# Patient Record
Sex: Female | Born: 1995 | Race: Black or African American | Hispanic: No | Marital: Single | Smoking: Current every day smoker
Health system: Southern US, Community
[De-identification: ages and names within clinical notes are randomized; demographics above are authoritative.]

## PROBLEM LIST (undated history)

## (undated) DIAGNOSIS — N898 Other specified noninflammatory disorders of vagina: Principal | ICD-10-CM

## (undated) DIAGNOSIS — B9689 Other specified bacterial agents as the cause of diseases classified elsewhere: Secondary | ICD-10-CM

## (undated) DIAGNOSIS — Z975 Presence of (intrauterine) contraceptive device: Secondary | ICD-10-CM

## (undated) DIAGNOSIS — R87629 Unspecified abnormal cytological findings in specimens from vagina: Secondary | ICD-10-CM

## (undated) DIAGNOSIS — N760 Acute vaginitis: Secondary | ICD-10-CM

## (undated) HISTORY — DX: Presence of (intrauterine) contraceptive device: Z97.5

## (undated) HISTORY — PX: INDUCED ABORTION: SHX677

## (undated) HISTORY — DX: Unspecified abnormal cytological findings in specimens from vagina: R87.629

## (undated) HISTORY — DX: Other specified noninflammatory disorders of vagina: N89.8

## (undated) HISTORY — DX: Other specified bacterial agents as the cause of diseases classified elsewhere: B96.89

## (undated) HISTORY — DX: Acute vaginitis: N76.0

---
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
## 2014-05-12 ENCOUNTER — Encounter (HOSPITAL_COMMUNITY): Payer: Self-pay | Admitting: Emergency Medicine

## 2014-05-12 ENCOUNTER — Emergency Department (HOSPITAL_COMMUNITY)
Admission: EM | Admit: 2014-05-12 | Discharge: 2014-05-12 | Disposition: A | Discharge status: Home / Self Care | Payer: BC Managed Care – PPO | Attending: Emergency Medicine | Admitting: Emergency Medicine

## 2014-05-12 DIAGNOSIS — F17200 Nicotine dependence, unspecified, uncomplicated: Secondary | ICD-10-CM | POA: Diagnosis not present

## 2014-05-12 DIAGNOSIS — J069 Acute upper respiratory infection, unspecified: Secondary | ICD-10-CM | POA: Insufficient documentation

## 2014-05-12 DIAGNOSIS — J029 Acute pharyngitis, unspecified: Secondary | ICD-10-CM | POA: Diagnosis not present

## 2014-05-12 DIAGNOSIS — Z79899 Other long term (current) drug therapy: Secondary | ICD-10-CM | POA: Insufficient documentation

## 2014-05-12 DIAGNOSIS — Z791 Long term (current) use of non-steroidal anti-inflammatories (NSAID): Secondary | ICD-10-CM | POA: Diagnosis not present

## 2014-05-12 DIAGNOSIS — J3489 Other specified disorders of nose and nasal sinuses: Secondary | ICD-10-CM | POA: Insufficient documentation

## 2014-05-12 LAB — RAPID STREP SCREEN (MED CTR MEBANE ONLY): Streptococcus, Group A Screen (Direct): NEGATIVE

## 2014-05-12 NOTE — ED Notes (Signed)
Pt reports sore throat. Voice hoarse in triage. Airway patent. nad noted. Pt denies any known fever.

## 2014-05-12 NOTE — ED Provider Notes (Signed)
CSN: [id number]     Arrival date & time 05/12/14  1554 History   First MD Initiated Contact with Patient 05/12/14 1741     Chief Complaint  Patient presents with  . Sore Throat    Patient is a 18 y.o. female presenting with pharyngitis. The history is provided by the patient. No language interpreter was used.  Sore Throat This is a new problem. The current episode started yesterday. The problem occurs constantly. Pertinent negatives include no chest pain, no abdominal pain, no headaches and no shortness of breath.   This chart was scribed for nurse practitioner Kerrie Buffalo , NP working with Joya Gaskins, MD, by Andrew Au, ED Scribe. This patient was seen in room APFT23/APFT23 and the patient's care was started at 5:42 PM.  Natasha Gill is a 18 y.o. female who presents to the Emergency Department complaining of sore throat 1 day. Pt rates sore throat 5/10.  She reports associated productive cough consisting of clear phlegm, hoarseness, and nasal congestion upon waking. 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 Pt denies fever,chills, erythematous, itchy eyes, abdominal pain, nausea, emesis, and diarrhea. She denies sick contacts at work.     History reviewed. No pertinent past medical history. Past Surgical History  Procedure Laterality Date  . Induced abortion     History reviewed. No pertinent family history. History  Substance Use Topics  . Smoking status: Heavy Tobacco Smoker -- 1.00 packs/day  . Smokeless tobacco: Not on file     Comment: pt reports a pack of cigarettes a week.  . Alcohol Use: No   OB History   Grav Para Term Preterm Abortions TAB SAB Ect Mult Living                 Review of Systems  Constitutional: Negative for fever and chills.  HENT: Positive for congestion, sore throat and voice change.   Eyes: Negative for redness and itching.  Respiratory: Positive for cough. Negative for shortness of  breath.   Cardiovascular: Negative for chest pain.  Gastrointestinal: Negative for nausea, vomiting, abdominal pain and diarrhea.  Neurological: Negative for headaches.      Allergies  Review of patient's allergies indicates no known allergies.  Home Medications   Prior to Admission medications   Medication Sig Start Date End Date Taking? Authorizing Provider  ibuprofen (ADVIL,MOTRIN) 800 MG tablet Take 1 tablet (800 mg total) by mouth 3 (three) times daily. 06/01/13   Kathie Dike, PA-C  metroNIDAZOLE (FLAGYL) 500 MG tablet Take 1 tablet (500 mg total) by mouth 2 (two) times daily. 06/24/12   Evalina Field, PA-C  PRESCRIPTION MEDICATION BIRTH CONTROL  NAME UNKNOWN TO PATIENT, NO HISTORY ON FILE WITH PHARMACY    Historical Provider, MD  pseudoephedrine (SUDAFED 12 HOUR) 120 MG 12 hr tablet Take 1 tablet (120 mg total) by mouth every 12 (twelve) hours. 06/01/13   Kathie Dike, PA-C   BP 127/82  Pulse 80  Temp(Src) 99 F (37.2 C) (Oral)  Resp 14  Ht  (1.651 m)  Wt 170 lb (77.111 kg)  BMI 28.29 kg/m2  SpO2 100%  LMP 04/17/2014 Physical Exam  Nursing note and vitals reviewed. Constitutional: She is oriented to person, place, and time. She appears well-developed and well-nourished. No distress.  HENT:  Head: Normocephalic and atraumatic.  Right Ear: Hearing, tympanic membrane, external ear and ear canal normal.  Left Ear: Hearing,  tympanic membrane, external ear and ear canal normal.  Mouth/Throat: Uvula is midline. Posterior oropharyngeal erythema ( mild) present. No posterior oropharyngeal edema.  Air ways patent.   Eyes: Conjunctivae and EOM are normal. Pupils are equal, round, and reactive to light.  Neck: Neck supple.  Cardiovascular: Normal rate, regular rhythm and normal heart sounds.  Exam reveals no gallop and no friction rub.   No murmur heard. Pulmonary/Chest: Effort normal and breath sounds normal. No respiratory distress. She has no wheezes. She has no  rales. She exhibits no tenderness.  Musculoskeletal: Normal range of motion.  Lymphadenopathy:    She has no cervical adenopathy.  Neurological: She is alert and oriented to person, place, and time.  Skin: Skin is warm and dry.  Psychiatric: She has a normal mood and affect. Her behavior is normal.    ED Course  Procedures  DIAGNOSTIC STUDIES: Oxygen Saturation is 100% on RA, normal by my interpretation.    COORDINATION OF CARE: 5:55 PM- Pt advised of plan for treatment and pt agrees.  Labs Review Labs Reviewed  RAPID STREP SCREEN  CULTURE, GROUP A STREP   Results for orders placed during the hospital encounter of 05/12/14  RAPID STREP SCREEN      Result Value Ref Range   Streptococcus, Group A Screen (Direct) NEGATIVE  NEGATIVE    MDM  18 y.o. female with URI symptoms and negative strep screen will treat for viral URI. Discussed with the patient clinical and lab findings and plan of care. All questioned fully answered. She will return if any problems arise.    Medication List    ASK your doctor about these medications       ibuprofen 800 MG tablet  Commonly known as:  ADVIL,MOTRIN  Take 1 tablet (800 mg total) by mouth 3 (three) times daily.     metroNIDAZOLE 500 MG tablet  Commonly known as:  FLAGYL  Take 1 tablet (500 mg total) by mouth 2 (two) times daily.     PRESCRIPTION MEDICATION  BIRTH CONTROL  NAME UNKNOWN TO PATIENT, NO HISTORY ON FILE WITH PHARMACY     pseudoephedrine 120 MG 12 hr tablet  Commonly known as:  SUDAFED 12 HOUR  Take 1 tablet (120 mg total) by mouth every 12 (twelve) hours.        I personally performed the services described in this documentation, which was scribed in my presence. The recorded information has been reviewed and is accurate.      [facility] Orlene Och, NP 05/14/14 0120

## 2014-05-14 LAB — CULTURE, GROUP A STREP

## 2014-05-14 NOTE — ED Provider Notes (Signed)
Medical screening examination/treatment/procedure(s) were performed by non-physician practitioner and as supervising physician I was immediately available for consultation/collaboration.   EKG Interpretation None        Laban Orourke W Darriana Deboy, MD 05/14/14 1222 

## 2014-11-28 ENCOUNTER — Ambulatory Visit (INDEPENDENT_AMBULATORY_CARE_PROVIDER_SITE_OTHER): Payer: BLUE CROSS/BLUE SHIELD | Admitting: Adult Health

## 2014-11-28 ENCOUNTER — Encounter: Payer: Self-pay | Admitting: Adult Health

## 2014-11-28 VITALS — BP 120/76 | HR 77 | Ht 65.0 in | Wt 160.0 lb

## 2014-11-28 DIAGNOSIS — N898 Other specified noninflammatory disorders of vagina: Secondary | ICD-10-CM | POA: Diagnosis not present

## 2014-11-28 DIAGNOSIS — Z975 Presence of (intrauterine) contraceptive device: Secondary | ICD-10-CM | POA: Diagnosis not present

## 2014-11-28 DIAGNOSIS — N760 Acute vaginitis: Secondary | ICD-10-CM | POA: Diagnosis not present

## 2014-11-28 DIAGNOSIS — B9689 Other specified bacterial agents as the cause of diseases classified elsewhere: Secondary | ICD-10-CM

## 2014-11-28 DIAGNOSIS — A499 Bacterial infection, unspecified: Secondary | ICD-10-CM

## 2014-11-28 HISTORY — DX: Other specified bacterial agents as the cause of diseases classified elsewhere: B96.89

## 2014-11-28 HISTORY — DX: Presence of (intrauterine) contraceptive device: Z97.5

## 2014-11-28 HISTORY — DX: Other specified noninflammatory disorders of vagina: N89.8

## 2014-11-28 LAB — POCT WET PREP (WET MOUNT): WBC, Wet Prep HPF POC: POSITIVE

## 2014-11-28 MED ORDER — METRONIDAZOLE 500 MG PO TABS: 500.0000 mg | ORAL_TABLET | Freq: Two times a day (BID) | ORAL | Status: DC

## 2014-11-28 NOTE — Patient Instructions (Signed)
Bacterial Vaginosis Bacterial vaginosis is a vaginal infection that occurs when the normal balance of bacteria in the vagina is disrupted. It results from an overgrowth of certain bacteria. This is the most common vaginal infection in women of childbearing age. Treatment is important to prevent complications, especially in pregnant women, as it can cause a premature delivery. CAUSES  Bacterial vaginosis is caused by an increase in harmful bacteria that are normally present in smaller amounts in the vagina. Several different kinds of bacteria can cause bacterial vaginosis. However, the reason that the condition develops is not fully understood. RISK FACTORS Certain activities or behaviors can put you at an increased risk of developing bacterial vaginosis, including:  Having a new sex partner or multiple sex partners.  Douching.  Using an intrauterine device (IUD) for contraception. Women do not get bacterial vaginosis from toilet seats, bedding, swimming pools, or contact with objects around them. SIGNS AND SYMPTOMS  Some women with bacterial vaginosis have no signs or symptoms. Common symptoms include:  Grey vaginal discharge.  A fishlike odor with discharge, especially after sexual intercourse.  Itching or burning of the vagina and vulva.  Burning or pain with urination. DIAGNOSIS  Your health care provider will take a medical history and examine the vagina for signs of bacterial vaginosis. A sample of vaginal fluid may be taken. Your health care provider will look at this sample under a microscope to check for bacteria and abnormal cells. A vaginal pH test may also be done.  TREATMENT  Bacterial vaginosis may be treated with antibiotic medicines. These may be given in the form of a pill or a vaginal cream. A second round of antibiotics may be prescribed if the condition comes back after treatment.  HOME CARE INSTRUCTIONS   Only take over-the-counter or prescription medicines as  directed by your health care provider.  If antibiotic medicine was prescribed, take it as directed. Make sure you finish it even if you start to feel better.  Do not have sex until treatment is completed.  Tell all sexual partners that you have a vaginal infection. They should see their health care provider and be treated if they have problems, such as a mild rash or itching.  Practice safe sex by using condoms and only having one sex partner. SEEK MEDICAL CARE IF:   Your symptoms are not improving after 3 days of treatment.  You have increased discharge or pain.  You have a fever. MAKE SURE YOU:   Understand these instructions.  Will watch your condition.  Will get help right away if you are not doing well or get worse. FOR MORE INFORMATION  Centers for Disease Control and Prevention, Division of STD Prevention: SolutionApps.co.zawww.cdc.gov/std American Sexual Health Association (ASHA): www.ashastd.org  Document Released: 08/30/2005 Document Revised: 06/20/2013 Document Reviewed: 04/11/2013 [facility] Patient Information 2015 [place], [place]. This information is not intended to replace advice given to you by your health care provider. Make sure you discuss any questions you have with your health care provider. Take flagyl  No alcohol Pap at 20  Follow up prn

## 2014-11-28 NOTE — Progress Notes (Signed)
Subjective:     Patient ID: Natasha Gill, female   DOB: 29-Aug-1996, 19 y.o.   MRN: [id number]  HPI Dillon Bjorkllyse is a 19 year old black female in complaining of vaginal discharge, with slight odor and itches at times,she has nexplanon and has irregular periods with it, she had it inserted in 2014.She wants to be check for GC/CHL.She is a new pt.   Review of Systems + vaginal discharge with odor and itch, all other systems negative Reviewed past medical,surgical, social and family history. Reviewed medications and allergies.     Objective:   Physical Exam BP 120/76 mmHg  Pulse 77  Ht 5\' 5"  (1.651 m)  Wt 160 lb (72.576 kg)  BMI 26.63 kg/m2  LMP    Skin warm and dry.Pelvic: external genitalia is normal in appearance no lesions, vagina: white discharge with odor,urethra has no lesions or masses noted, cervix:smooth, uterus: normal size, shape and contour, non tender, no masses felt, adnexa: no masses or tenderness noted. Bladder is non tender and no masses felt. Wet prep: + for clue cells and +WBCs. GC/CHL obtained.   Assessment:     Vaginal discharge BV nexplanon in place    Plan:    GC/CHL sent Rx flagyl 500 mg 1 bid x 7 days, no alcohol, review handout on BV   Pap at 20  Follow up prn

## 2014-11-29 LAB — GC/CHLAMYDIA PROBE AMP
CHLAMYDIA, DNA PROBE: NEGATIVE
Neisseria gonorrhoeae by PCR: NEGATIVE

## 2015-06-16 ENCOUNTER — Ambulatory Visit: Payer: BLUE CROSS/BLUE SHIELD | Admitting: Adult Health

## 2015-06-17 ENCOUNTER — Ambulatory Visit: Payer: BLUE CROSS/BLUE SHIELD | Admitting: Adult Health

## 2015-06-17 ENCOUNTER — Encounter: Payer: Self-pay | Admitting: Adult Health

## 2015-10-28 ENCOUNTER — Encounter: Payer: BLUE CROSS/BLUE SHIELD | Admitting: Advanced Practice Midwife

## 2015-11-11 ENCOUNTER — Ambulatory Visit (INDEPENDENT_AMBULATORY_CARE_PROVIDER_SITE_OTHER): Payer: BLUE CROSS/BLUE SHIELD | Admitting: Obstetrics and Gynecology

## 2015-11-11 ENCOUNTER — Encounter: Payer: Self-pay | Admitting: Obstetrics and Gynecology

## 2015-11-11 VITALS — BP 120/72 | Ht 65.0 in | Wt 148.0 lb

## 2015-11-11 DIAGNOSIS — R300 Dysuria: Secondary | ICD-10-CM

## 2015-11-11 DIAGNOSIS — N3090 Cystitis, unspecified without hematuria: Secondary | ICD-10-CM

## 2015-11-11 HISTORY — DX: Cystitis, unspecified without hematuria: N30.90

## 2015-11-11 LAB — POCT URINALYSIS DIPSTICK
Glucose, UA: NEGATIVE
Ketones, UA: NEGATIVE
Leukocytes, UA: NEGATIVE
NITRITE UA: POSITIVE
RBC UA: NEGATIVE

## 2015-11-11 MED ORDER — SULFAMETHOXAZOLE-TRIMETHOPRIM 400-80 MG PO TABS: 1.0000 | ORAL_TABLET | Freq: Two times a day (BID) | ORAL | Status: DC

## 2015-11-11 NOTE — Progress Notes (Signed)
Patient ID: Natasha Gill, female   DOB: [birth year], 20 y.o.   MRN: [id number]  Pt here today for possible bladder infection. Pt states that after she voids she has a pressure/ pain. Pt states that she has had these symptoms since last week.

## 2015-11-11 NOTE — Progress Notes (Addendum)
Patient ID: Natasha Gill, female   DOB: 1996-03-19, 20 y.o.   MRN: [id number]   [facility] Visit  Patient name: Natasha Gill MRN [id number]  Date of birth: 03-Apr-1996  CC & HPI:  Natasha Gill is a 20 y.o. female presenting today for dysuria, x 3 days. Frequency, nocturia  X 1-2.   ROS:  No prior hx cystitis.since childhood  Pertinent History Reviewed:   Reviewed: Significant for urinary frequency Medical         Past Medical History  Diagnosis Date  . Vaginal discharge 11/28/2014  . BV (bacterial vaginosis) 11/28/2014  . Nexplanon in place 11/28/2014                              Surgical Hx:    Past Surgical History  Procedure Laterality Date  . Induced abortion     Medications: Reviewed & Updated - see associated section                       Current outpatient prescriptions:  .  etonogestrel (NEXPLANON) 68 MG IMPL implant, 1 each by Subdermal route once., Disp: , Rfl:    Social History: Reviewed -  reports that she has been smoking Cigarettes.  She has a 1 pack-year smoking history. She has never used smokeless tobacco.  Objective Findings:  Vitals: Blood pressure 120/72, height  (1.651 m), weight 148 lb (67.132 kg), last menstrual period 10/08/2015.  Physical Examination: Abdomen - soft, nontender, nondistended, no masses or organomegaly no bladder distension noted   Assessment & Plan:   A:  1. Cystitis  P:  1. Rx Septra ds bid x 7 d 2. nexplanon in June replacement  Here or health dept.  Med switched to Macrobid when cultures return R to septra. JVF (3/2)

## 2015-11-13 ENCOUNTER — Telehealth: Payer: Self-pay | Admitting: *Deleted

## 2015-11-13 LAB — URINE CULTURE

## 2015-11-13 MED ORDER — NITROFURANTOIN MONOHYD MACRO 100 MG PO CAPS: 100.0000 mg | ORAL_CAPSULE | Freq: Two times a day (BID) | ORAL | Status: DC

## 2015-11-13 NOTE — Telephone Encounter (Signed)
Unable to contact pt due to wrong number in computer.

## 2015-11-13 NOTE — Telephone Encounter (Signed)
-----   Message from Tilda Burrow, MD sent at 11/13/2015 12:40 PM EST ----- Organism R to septra, will change to Macrodantin

## 2015-11-13 NOTE — Addendum Note (Signed)
Addended by: Tilda Burrow on: 11/13/2015 12:42 PM   Modules accepted: Orders, Medications

## 2016-08-23 ENCOUNTER — Other Ambulatory Visit: Payer: BLUE CROSS/BLUE SHIELD | Admitting: Adult Health

## 2016-09-03 ENCOUNTER — Other Ambulatory Visit: Payer: BLUE CROSS/BLUE SHIELD | Admitting: Women's Health

## 2018-08-07 ENCOUNTER — Other Ambulatory Visit: Payer: Self-pay

## 2018-08-07 ENCOUNTER — Encounter (HOSPITAL_COMMUNITY): Payer: Self-pay | Admitting: Emergency Medicine

## 2018-08-07 ENCOUNTER — Emergency Department (HOSPITAL_COMMUNITY)
Admission: EM | Admit: 2018-08-07 | Discharge: 2018-08-07 | Disposition: A | Discharge status: Home / Self Care | Payer: BLUE CROSS/BLUE SHIELD | Attending: Emergency Medicine | Admitting: Emergency Medicine

## 2018-08-07 DIAGNOSIS — F17210 Nicotine dependence, cigarettes, uncomplicated: Secondary | ICD-10-CM | POA: Insufficient documentation

## 2018-08-07 DIAGNOSIS — L509 Urticaria, unspecified: Secondary | ICD-10-CM | POA: Diagnosis not present

## 2018-08-07 DIAGNOSIS — R21 Rash and other nonspecific skin eruption: Secondary | ICD-10-CM

## 2018-08-07 MED ORDER — DIPHENHYDRAMINE HCL 25 MG PO CAPS: 25.0000 mg | ORAL_CAPSULE | Freq: Four times a day (QID) | ORAL | 0 refills | Status: DC | PRN

## 2018-08-07 MED ORDER — DEXAMETHASONE SODIUM PHOSPHATE 4 MG/ML IJ SOLN
10.0000 mg | Freq: Once | INTRAMUSCULAR | Status: AC
  Administered 2018-08-07: 10 mg via INTRAMUSCULAR
  Filled 2018-08-07: qty 3

## 2018-08-07 MED ORDER — PREDNISONE 10 MG PO TABS: 50.0000 mg | ORAL_TABLET | Freq: Every day | ORAL | 0 refills | Status: DC

## 2018-08-07 NOTE — Discharge Instructions (Addendum)
We suspect that your rash is because of hypersensitivity.  We are unsure as to what the provoking factor is.  We recommend that you take the medications prescribed and follow-up with the primary care doctor in 3 to 5 days.  You might need further allergy testing or work-up for other conditions that can manifest as rash if your symptoms are not improving.  Return to the ER immediately if you start having shortness of breath or tongue swelling.

## 2018-08-07 NOTE — ED Triage Notes (Addendum)
Pt C/O hives and itching that started on Saturday. Pt denies doing anything new that would have caused the rash. Pt C/O chest pain. Pt also states it feels like there is a lump in her throat when she is lying flat. Pt states it "feels like it is burning."

## 2018-08-07 NOTE — ED Provider Notes (Signed)
[facility]   CSN: [id number] Arrival date & time: 08/07/18  0306     History   Chief Complaint Chief Complaint  Patient presents with  . Rash    HPI Natasha Gill is a 22 y.o. female.  HPI  22 year old female comes in with chief complaint of rash.  Patient reports that she started developing a rash all over her body 2 days ago.  Patient describes the rash as red and itchy.  She has had hives in the past, however not to this extent.  Patient has no known allergies and she denies any skin disorder.  There is no family history of any autoimmune condition.  Patient denies any mucosal involvement in her mouth or anal genital region.  She denies any new exposures.  Patient did just finish a course of Flagyl the same day the symptoms started.  There is no any family history of autoimmune conditions such as lupus or skin disorders.  Past Medical History:  Diagnosis Date  . BV (bacterial vaginosis) 11/28/2014  . Nexplanon in place 11/28/2014  . Vaginal discharge 11/28/2014    Patient Active Problem List   Diagnosis Date Noted  . Cystitis 11/11/2015  . Vaginal discharge 11/28/2014  . BV (bacterial vaginosis) 11/28/2014  . Nexplanon in place 11/28/2014  . FOOT PAIN 12/12/2008  . 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 JOINT EFFUSION, LEFT KNEE 11/19/2008  . ANKLE PAIN, RIGHT 11/19/2008    Past Surgical History:  Procedure Laterality Date  . INDUCED ABORTION       OB History    Gravida  1   Para      Term      Preterm      AB  1   Living        SAB      TAB  1   Ectopic      Multiple      Live Births               Home Medications    Prior to Admission medications   Medication Sig Start Date End Date Taking? Authorizing Provider  etonogestrel (NEXPLANON) 68 MG IMPL implant 1 each by Subdermal route once.    [provider]  nitrofurantoin, macrocrystal-monohydrate, (MACROBID) 100 MG capsule Take 1  capsule (100 mg total) by mouth 2 (two) times daily. For uti 11/13/15   Tilda BurrowFerguson, John V, MD    Family History No family history on file.  Social History Social History   Tobacco Use  . Smoking status: Current Every Day Smoker    Packs/day: 0.50    Years: 2.00    Pack years: 1.00    Types: Cigarettes  . Smokeless tobacco: Never Used  Substance Use Topics  . Alcohol use: No  . Drug use: No     Allergies   Patient has no known allergies.   Review of Systems Review of Systems  Constitutional: Positive for activity change.  Respiratory: Negative for shortness of breath.   Cardiovascular: Negative for chest pain.  Skin: Positive for rash.  Allergic/Immunologic: Negative for environmental allergies, food allergies and immunocompromised state.     Physical Exam Updated Vital Signs BP 129/82 (BP Location: Right Arm)   Pulse 100   Temp 98 F (36.7 C) (Oral)   Resp 16   LMP 07/15/2018   SpO2 100%   Physical Exam  Constitutional: She appears well-developed.  HENT:  Head: Atraumatic.  Neck: Neck  supple.  Cardiovascular: Normal rate.  Pulmonary/Chest: Effort normal. She has no wheezes.  Neurological: She is alert.  Skin: Skin is warm and dry. Rash noted. There is erythema.  Generalized urticarial rash extending over upper and lower extremities and the torso.  Oral mucosa is normal.  Nursing note and vitals reviewed.    ED Treatments / Results  Labs (all labs ordered are listed, but only abnormal results are displayed) Labs Reviewed - No data to display  EKG None  Radiology No results found.  Procedures Procedures (including critical care time)  Medications Ordered in ED Medications  dexamethasone (DECADRON) injection 10 mg (has no administration in time range)     Initial Impression / Assessment and Plan / ED Course  I have reviewed the triage vital signs and the nursing notes.  Pertinent labs & imaging results that were available during my care of  the patient were reviewed by me and considered in my medical decision making (see chart for details).     Patient comes in with an urticarial rash. It is a nonspecific eruption given that we do not have any specific provoking factor and there is no history of skin disorder/allergies.  No recent viral infection and no other systemic symptoms.  We will start patient on steroids and Benadryl.  She will follow-up with her PCP in 3 to 5 days.  Strict ER return precautions have also been discussed with the patient and the mother.  Final Clinical Impressions(s) / ED Diagnoses   Final diagnoses:  Urticarial rash  Rash and nonspecific skin eruption    ED Discharge Orders    None       Derwood Kaplan, MD 08/07/18 [phone]

## 2018-12-15 ENCOUNTER — Telehealth: Payer: Self-pay | Admitting: Adult Health

## 2018-12-15 NOTE — Telephone Encounter (Signed)
Spoke with pt. Pt just moved back to [place]. Pt has scheduled an appt with [facility] because they could get her in Monday. Pt states once this Covid-19 dies down, she will schedule an appt with [place]. JSY

## 2018-12-15 NOTE — Telephone Encounter (Signed)
Patient would like to see if she can come in for a STD screening along with a pap smear. Please advise.

## 2019-12-01 ENCOUNTER — Ambulatory Visit: Payer: BC Managed Care – PPO | Attending: Internal Medicine

## 2019-12-01 DIAGNOSIS — Z23 Encounter for immunization: Secondary | ICD-10-CM

## 2019-12-01 NOTE — Progress Notes (Signed)
   Covid-19 Vaccination Clinic  Name:  MAILEY LANDSTROM    MRN: [id number] DOB: 1996-09-11  12/01/2019  Ms. Vogan was observed post Covid-19 immunization for 15 minutes without incident. She was provided with Vaccine Information Sheet and instruction to access the V-Safe system.   Ms. Guderian was instructed to call 911 with any severe reactions post vaccine: Marland Kitchen Difficulty breathing  . Swelling of face and throat  . A fast heartbeat  . A bad rash all over body  . Dizziness and weakness   Immunizations Administered    Name Date Dose VIS Date Route   Pfizer COVID-19 Vaccine 12/01/2019 10:10 AM 0.3 mL 08/24/2019 Intramuscular   Manufacturer: [employer], [employer]   Lot: [id number]   NDC: [address]-4

## 2019-12-29 ENCOUNTER — Ambulatory Visit: Payer: BC Managed Care – PPO | Attending: Internal Medicine

## 2019-12-29 DIAGNOSIS — Z23 Encounter for immunization: Secondary | ICD-10-CM

## 2019-12-29 NOTE — Progress Notes (Signed)
   Covid-19 Vaccination Clinic  Name:  Natasha Gill    MRN: [id number] DOB: 02-17-1996  12/29/2019  [name] was observed post Covid-19 immunization for 15 minutes without incident. She was provided with Vaccine Information Sheet and instruction to access the V-Safe system.   [name] was instructed to call 911 with any severe reactions post vaccine: Marland Kitchen Difficulty breathing  . Swelling of face and throat  . A fast heartbeat  . A bad rash all over body  . Dizziness and weakness   Immunizations Administered    Name Date Dose VIS Date Route   Pfizer COVID-19 Vaccine 12/29/2019 10:15 AM 0.3 mL 08/24/2019 Intramuscular   Manufacturer: [employer], [employer]   Lot: [id number]   NDC: [address]-9

## 2020-06-22 ENCOUNTER — Other Ambulatory Visit: Payer: Self-pay

## 2020-06-22 ENCOUNTER — Emergency Department (HOSPITAL_COMMUNITY)
Admission: EM | Admit: 2020-06-22 | Discharge: 2020-06-22 | Disposition: A | Discharge status: Home / Self Care | Payer: BC Managed Care – PPO | Attending: Emergency Medicine | Admitting: Emergency Medicine

## 2020-06-22 ENCOUNTER — Encounter (HOSPITAL_COMMUNITY): Payer: Self-pay | Admitting: Emergency Medicine

## 2020-06-22 DIAGNOSIS — R110 Nausea: Secondary | ICD-10-CM | POA: Insufficient documentation

## 2020-06-22 DIAGNOSIS — R42 Dizziness and giddiness: Secondary | ICD-10-CM | POA: Diagnosis not present

## 2020-06-22 DIAGNOSIS — H53149 Visual discomfort, unspecified: Secondary | ICD-10-CM | POA: Insufficient documentation

## 2020-06-22 DIAGNOSIS — Z5321 Procedure and treatment not carried out due to patient leaving prior to being seen by health care provider: Secondary | ICD-10-CM | POA: Diagnosis not present

## 2020-06-22 NOTE — ED Triage Notes (Signed)
Pt c/o of dizziness since noon today with light sensitivity to eyes. Reports nausea as well

## 2022-09-13 NOTE — L&D Delivery Note (Signed)
Delivery Note At 7:09 PM a {baby status:[id number]} female was delivered via Vaginal, Spontaneous (Presentation:   Occiput Posterior).  APGAR: , ; weight  .   Placenta status: Spontaneous, Intact.  Cord: 3 vessels with the following complications: None.  Cord pH: ***  Anesthesia: Epidural Episiotomy: None Lacerations: 1st degree;Perineal Suture Repair: {suture UJWJ:[id number]} Est. Blood Loss (mL): 30  Mom to {mom status:3041454}.  Baby to {baby status:3041455}.  Han Vejar A Sigurd Pugh 09/07/2023, 7:28 PM

## 2023-02-23 LAB — OB RESULTS CONSOLE RUBELLA ANTIBODY, IGM: Rubella: IMMUNE

## 2023-02-23 LAB — OB RESULTS CONSOLE VARICELLA ZOSTER ANTIBODY, IGG: Varicella: IMMUNE

## 2023-02-23 LAB — OB RESULTS CONSOLE HIV ANTIBODY (ROUTINE TESTING): HIV: NONREACTIVE

## 2023-02-23 LAB — OB RESULTS CONSOLE RPR: RPR: NONREACTIVE

## 2023-02-23 LAB — OB RESULTS CONSOLE HEPATITIS B SURFACE ANTIGEN: Hepatitis B Surface Ag: NEGATIVE

## 2023-02-24 LAB — OB RESULTS CONSOLE GC/CHLAMYDIA
Chlamydia: NEGATIVE
Neisseria Gonorrhea: NEGATIVE

## 2023-03-25 LAB — OB RESULTS CONSOLE ABO/RH: RH Type: POSITIVE

## 2023-04-26 ENCOUNTER — Other Ambulatory Visit: Payer: Self-pay

## 2023-04-26 ENCOUNTER — Observation Stay (HOSPITAL_COMMUNITY)
Admission: AD | Admit: 2023-04-26 | Discharge: 2023-04-27 | DRG: 819 | Disposition: A | Discharge status: Home / Self Care | Payer: No Typology Code available for payment source | Attending: Obstetrics & Gynecology | Admitting: Obstetrics & Gynecology

## 2023-04-26 ENCOUNTER — Encounter (HOSPITAL_COMMUNITY): Payer: Self-pay | Admitting: Obstetrics & Gynecology

## 2023-04-26 DIAGNOSIS — L02224 Furuncle of groin: Secondary | ICD-10-CM | POA: Diagnosis present

## 2023-04-26 DIAGNOSIS — O99712 Diseases of the skin and subcutaneous tissue complicating pregnancy, second trimester: Secondary | ICD-10-CM | POA: Diagnosis present

## 2023-04-26 DIAGNOSIS — N883 Incompetence of cervix uteri: Secondary | ICD-10-CM | POA: Diagnosis present

## 2023-04-26 DIAGNOSIS — Z8249 Family history of ischemic heart disease and other diseases of the circulatory system: Secondary | ICD-10-CM

## 2023-04-26 DIAGNOSIS — L738 Other specified follicular disorders: Secondary | ICD-10-CM | POA: Diagnosis present

## 2023-04-26 DIAGNOSIS — O99332 Smoking (tobacco) complicating pregnancy, second trimester: Secondary | ICD-10-CM | POA: Diagnosis present

## 2023-04-26 DIAGNOSIS — Z809 Family history of malignant neoplasm, unspecified: Secondary | ICD-10-CM | POA: Diagnosis not present

## 2023-04-26 DIAGNOSIS — F17210 Nicotine dependence, cigarettes, uncomplicated: Secondary | ICD-10-CM | POA: Diagnosis present

## 2023-04-26 DIAGNOSIS — Z3A20 20 weeks gestation of pregnancy: Secondary | ICD-10-CM | POA: Diagnosis not present

## 2023-04-26 DIAGNOSIS — O99012 Anemia complicating pregnancy, second trimester: Secondary | ICD-10-CM | POA: Diagnosis present

## 2023-04-26 DIAGNOSIS — O3432 Maternal care for cervical incompetence, second trimester: Principal | ICD-10-CM

## 2023-04-26 DIAGNOSIS — Z833 Family history of diabetes mellitus: Secondary | ICD-10-CM | POA: Diagnosis not present

## 2023-04-26 DIAGNOSIS — Z880 Allergy status to penicillin: Secondary | ICD-10-CM | POA: Diagnosis not present

## 2023-04-26 HISTORY — DX: Maternal care for cervical incompetence, second trimester: O34.32

## 2023-04-26 LAB — COMPREHENSIVE METABOLIC PANEL
ALT: 28 U/L (ref 0–44)
AST: 22 U/L (ref 15–41)
Albumin: 3.1 g/dL — ABNORMAL LOW (ref 3.5–5.0)
Alkaline Phosphatase: 47 U/L (ref 38–126)
Anion gap: 10 (ref 5–15)
BUN: 10 mg/dL (ref 6–20)
CO2: 23 mmol/L (ref 22–32)
Calcium: 9.1 mg/dL (ref 8.9–10.3)
Chloride: 101 mmol/L (ref 98–111)
Creatinine, Ser: 0.65 mg/dL (ref 0.44–1.00)
GFR, Estimated: >60 mL/min (ref >60)
Glucose, Bld: 100 mg/dL — ABNORMAL HIGH (ref 70–99)
Potassium: 3.8 mmol/L (ref 3.5–5.1)
Sodium: 134 mmol/L — ABNORMAL LOW (ref 135–145)
Total Bilirubin: 0.3 mg/dL (ref 0.3–1.2)
Total Protein: 6.2 g/dL — ABNORMAL LOW (ref 6.5–8.1)

## 2023-04-26 LAB — CBC
HCT: 29.7 % — ABNORMAL LOW (ref 36.0–46.0)
Hemoglobin: 9.3 g/dL — ABNORMAL LOW (ref 12.0–15.0)
MCH: 23.3 pg — ABNORMAL LOW (ref 26.0–34.0)
MCHC: 31.3 g/dL (ref 30.0–36.0)
MCV: 74.3 fL — ABNORMAL LOW (ref 80.0–100.0)
Platelets: 212 10*3/uL (ref 150–400)
RBC: 4 MIL/uL (ref 3.87–5.11)
RDW: 14 % (ref 11.5–15.5)
WBC: 11.6 10*3/uL — ABNORMAL HIGH (ref 4.0–10.5)
nRBC: 0 % (ref 0.0–0.2)

## 2023-04-26 LAB — FERRITIN: Ferritin: 91 ng/mL (ref 11–307)

## 2023-04-26 MED ORDER — CALCIUM CARBONATE ANTACID 500 MG PO CHEW: 2.0000 | CHEWABLE_TABLET | ORAL | Status: DC | PRN

## 2023-04-26 MED ORDER — ACETAMINOPHEN 325 MG PO TABS
650.0000 mg | ORAL_TABLET | ORAL | Status: DC | PRN
  Administered 2023-04-26: 650 mg via ORAL
  Filled 2023-04-26: qty 2

## 2023-04-26 MED ORDER — LACTATED RINGERS IV SOLN
125.0000 mL/h | INTRAVENOUS | Status: AC
  Administered 2023-04-27: 125 mL/h via INTRAVENOUS

## 2023-04-26 MED ORDER — PRENATAL MULTIVITAMIN CH: 1.0000 | ORAL_TABLET | Freq: Every day | ORAL | Status: DC

## 2023-04-26 MED ORDER — DOCUSATE SODIUM 100 MG PO CAPS: 100.0000 mg | ORAL_CAPSULE | Freq: Every day | ORAL | Status: DC

## 2023-04-26 NOTE — H&P (Addendum)
CHRYSTYNA PEARY is a 27 y.o. female G2P0010, 19.6 wks, presenting for antepartum admission and likely cerclage after MFM assessment Office sono last wk for anatomy noted cervix was short, CL 2.4-2.7 cm, sono in office today- CL<2 cm with complete funneling. Pt needs sono to assess cervix and MFM consult and assistance for emergency cerclage if indicated  No H/o LEEP or conization. ObHx one 1st TAB  Pt denies any pain/ cramps/ vag fluid/ bleeding    OB History     Gravida  2   Para      Term      Preterm      AB  1   Living         SAB      IAB  1   Ectopic      Multiple      Live Births             Past Medical History:  Diagnosis Date   BV (bacterial vaginosis) 11/28/2014   Incompetent cervix in pregnancy, antepartum, second trimester 04/26/2023   Nexplanon in place 11/28/2014   Vaginal discharge 11/28/2014   Past Surgical History:  Procedure Laterality Date   INDUCED ABORTION     Family History: family history includes Cancer in her maternal aunt, maternal grandmother, and paternal aunt; Diabetes in her father; Hypertension in her mother; Miscarriages / [place] in her sister. Social History:  reports that she has been smoking cigarettes. She has a 1 pack-year smoking history. She has never used smokeless tobacco. She reports that she does not drink alcohol and does not use drugs.    Blood pressure 115/62, pulse 76, resp. rate 19, last menstrual period 12/05/2022, SpO2 98%. Exam Physical Exam  A&O x 3, no acute distress. Pleasant HEENT neg, no thyromegaly Lungs CTA bilat CV RRR, S1S2 normal Abdo soft, non tender, non acute Extr no edema/ tenderness Pelvic deferred FHT  140s Toco none  Assessment/Plan: 27 yo G2P0 at 19.6 wks with dynamic vs incompetent cervix. Admit with trendelenburg overnight. MFM to assess cervix w/ ultrasound and plan emergency cerclage if candidate.  NPO after midnight.  Also Anemia- PO iron check ferritin and may be a  candidate for IV Venofer  Pt counseled on diagnosis/ options incl expectant mngmt with bed rest vs cerclage vs vaginal prometrium and r/b/c if all . We reviewed cerclage procedure/ risks/ possible rupture membranes and loss of pregnancy and possible benefit to get to 3rd trim and hopefully full term.  She understands and will await MFM recommendations in Am    50 min face to face with patient ssessment/ counseling with reviewing independently her sono images from office .   Robley Fries 04/26/2023, 7:13 PM

## 2023-04-27 ENCOUNTER — Other Ambulatory Visit: Payer: Self-pay

## 2023-04-27 ENCOUNTER — Inpatient Hospital Stay (HOSPITAL_COMMUNITY): Payer: No Typology Code available for payment source | Admitting: Anesthesiology

## 2023-04-27 ENCOUNTER — Inpatient Hospital Stay (HOSPITAL_COMMUNITY): Payer: No Typology Code available for payment source

## 2023-04-27 ENCOUNTER — Encounter (HOSPITAL_COMMUNITY)
Admission: AD | Disposition: A | Discharge status: Home / Self Care | Payer: Self-pay | Source: Home / Self Care | Attending: Obstetrics & Gynecology

## 2023-04-27 DIAGNOSIS — Z3A20 20 weeks gestation of pregnancy: Secondary | ICD-10-CM | POA: Diagnosis not present

## 2023-04-27 DIAGNOSIS — O3432 Maternal care for cervical incompetence, second trimester: Secondary | ICD-10-CM | POA: Diagnosis not present

## 2023-04-27 DIAGNOSIS — N883 Incompetence of cervix uteri: Secondary | ICD-10-CM | POA: Diagnosis not present

## 2023-04-27 HISTORY — PX: CERVICAL CERCLAGE: SHX1329

## 2023-04-27 SURGERY — CERCLAGE, CERVIX, VAGINAL APPROACH
Anesthesia: Spinal

## 2023-04-27 MED ORDER — LACTATED RINGERS IV SOLN: INTRAVENOUS | Status: DC | PRN

## 2023-04-27 MED ORDER — STERILE WATER FOR IRRIGATION IR SOLN
Status: DC | PRN
  Administered 2023-04-27: 1000 mL

## 2023-04-27 MED ORDER — ACETAMINOPHEN 10 MG/ML IV SOLN
INTRAVENOUS | Status: AC
  Filled 2023-04-27: qty 100

## 2023-04-27 MED ORDER — PHENYLEPHRINE 80 MCG/ML (10ML) SYRINGE FOR IV PUSH (FOR BLOOD PRESSURE SUPPORT)
PREFILLED_SYRINGE | INTRAVENOUS | Status: AC
  Filled 2023-04-27: qty 10

## 2023-04-27 MED ORDER — MEPERIDINE HCL 25 MG/ML IJ SOLN: 6.2500 mg | INTRAMUSCULAR | Status: DC | PRN

## 2023-04-27 MED ORDER — POVIDONE-IODINE 10 % EX SWAB
2.0000 | Freq: Once | CUTANEOUS | Status: AC
  Administered 2023-04-27: 2 via TOPICAL

## 2023-04-27 MED ORDER — ONDANSETRON HCL 4 MG/2ML IJ SOLN
INTRAMUSCULAR | Status: AC
  Filled 2023-04-27: qty 2

## 2023-04-27 MED ORDER — CLINDAMYCIN PHOSPHATE 900 MG/50ML IV SOLN
900.0000 mg | Freq: Once | INTRAVENOUS | Status: AC
  Administered 2023-04-27: 900 mg via INTRAVENOUS
  Filled 2023-04-27: qty 50

## 2023-04-27 MED ORDER — FENTANYL CITRATE (PF) 100 MCG/2ML IJ SOLN
25.0000 ug | INTRAMUSCULAR | Status: DC | PRN
  Administered 2023-04-27: 25 ug via INTRAVENOUS

## 2023-04-27 MED ORDER — CHLOROPROCAINE HCL (PF) 3 % IJ SOLN
INTRAMUSCULAR | Status: AC
  Filled 2023-04-27: qty 20

## 2023-04-27 MED ORDER — ACETAMINOPHEN 10 MG/ML IV SOLN
INTRAVENOUS | Status: DC | PRN
  Administered 2023-04-27: 1000 mg via INTRAVENOUS

## 2023-04-27 MED ORDER — PROMETHAZINE HCL 25 MG/ML IJ SOLN: 6.2500 mg | INTRAMUSCULAR | Status: DC | PRN

## 2023-04-27 MED ORDER — FENTANYL CITRATE (PF) 100 MCG/2ML IJ SOLN
INTRAMUSCULAR | Status: AC
  Filled 2023-04-27: qty 2

## 2023-04-27 MED ORDER — SOD CITRATE-CITRIC ACID 500-334 MG/5ML PO SOLN
ORAL | Status: AC
  Filled 2023-04-27: qty 30

## 2023-04-27 MED ORDER — ONDANSETRON HCL 4 MG/2ML IJ SOLN
INTRAMUSCULAR | Status: DC | PRN
  Administered 2023-04-27: 4 mg via INTRAVENOUS

## 2023-04-27 MED ORDER — ACETAMINOPHEN 10 MG/ML IV SOLN: 1000.0000 mg | Freq: Once | INTRAVENOUS | Status: DC

## 2023-04-27 MED ORDER — CHLOROPROCAINE HCL (PF) 3 % IJ SOLN
INTRAMUSCULAR | Status: DC | PRN
  Administered 2023-04-27: 1.5 mL

## 2023-04-27 MED ORDER — CEFAZOLIN SODIUM-DEXTROSE 2-4 GM/100ML-% IV SOLN: 2.0000 g | INTRAVENOUS | Status: DC

## 2023-04-27 MED ORDER — PHENYLEPHRINE HCL (PRESSORS) 10 MG/ML IV SOLN
INTRAVENOUS | Status: DC | PRN
  Administered 2023-04-27: 160 ug via INTRAVENOUS

## 2023-04-27 SURGICAL SUPPLY — 20 items
CANISTER SUCT 3000ML PPV (MISCELLANEOUS) ×1 IMPLANT
CATH CERVICAL RIPENING BALLOON (CATHETERS) ×1 IMPLANT
CATH ROBINSON RED A/P 16FR (CATHETERS) IMPLANT
ELECT REM PT RETURN 9FT ADLT (ELECTROSURGICAL) ×1
ELECTRODE REM PT RTRN 9FT ADLT (ELECTROSURGICAL) ×1 IMPLANT
GLOVE BIO SURGEON STRL SZ7.5 (GLOVE) ×1 IMPLANT
GLOVE BIOGEL PI IND STRL 8 (GLOVE) ×1 IMPLANT
GOWN STRL REUS W/ TWL LRG LVL3 (GOWN DISPOSABLE) ×2 IMPLANT
GOWN STRL REUS W/TWL LRG LVL3 (GOWN DISPOSABLE) ×2
HIBICLENS CHG 4% 4OZ BTL (MISCELLANEOUS) ×1 IMPLANT
PACK VAGINAL MINOR WOMEN LF (CUSTOM PROCEDURE TRAY) ×1 IMPLANT
PAD OB MATERNITY 4.3X12.25 (PERSONAL CARE ITEMS) ×1 IMPLANT
PAD PREP 24X48 CUFFED NSTRL (MISCELLANEOUS) ×1 IMPLANT
PENCIL BUTTON HOLSTER BLD 10FT (ELECTRODE) ×1 IMPLANT
SUT MERSILENE FIBER S 5 MO-4 1 (SUTURE) ×1 IMPLANT
SYR BULB IRRIGATION 50ML (SYRINGE) IMPLANT
TOWEL OR 17X24 6PK STRL BLUE (TOWEL DISPOSABLE) ×2 IMPLANT
TRAY FOLEY W/BAG SLVR 14FR (SET/KITS/TRAYS/PACK) ×1 IMPLANT
TUBING NON-CON 1/4 X 20 CONN (TUBING) ×1 IMPLANT
YANKAUER SUCT BULB TIP NO VENT (SUCTIONS) ×1 IMPLANT

## 2023-04-27 NOTE — Discharge Summary (Signed)
Physician Discharge Summary  Patient ID: Natasha Gill MRN: [id number] DOB/AGE: 1996-07-16 27 y.o.  Admit date: 04/26/2023 Discharge date: 04/27/2023  Admission Diagnoses: Single intrauterine pregnancy, second trimester Incompetent cervix  Discharge Diagnoses:  Principal Problem:   Incompetent cervix in pregnancy, antepartum, second trimester Active Problems:   [redacted] weeks gestation of pregnancy   Discharged Condition: good  Hospital Course: 04/26/23: HD#1 26Y G2P0010 @ 19.6 admitted with shortened cervix identified on the office an anatomy ultrasound. Patient was noted to have shorted cervix at routine anatomy scan the week prior with CL 2.4-2.7 cm and 1 week follow up showed CL < 2cm with slight dilation. She was admitted for observation pending MFM consultation to evaluate for emergency cerclage candidate. 04/27/23: HD#2 @ 20 weeks 0 days. Patient had MFM consultation done with Dr. Grace Bushy in AM with [place] evaluation showing dynamic cervix 0.76 to 1.26 cm. She was consented by Dr. Judeth Cornfield who performed rescue cervical cerclage placement in OR. See Operative Note for details. Patient recovered in PACU stable and discharged home with precautions. Modified bed rest with pelvic rest and instructed to start vaginal progesterone tomorrow. Outpatient follow up US in 1-2 weeks.   Consults:  Maternal Fetal Medicine  Significant Diagnostic Studies: labs: CBC    Component Value Date/Time   WBC 11.6 (H) 04/26/2023 2024   RBC 4.00 04/26/2023 2024   HGB 9.3 (L) 04/26/2023 2024   HCT 29.7 (L) 04/26/2023 2024   PLT 212 04/26/2023 2024   MCV 74.3 (L) 04/26/2023 2024   MCH 23.3 (L) 04/26/2023 2024   MCHC 31.3 04/26/2023 2024   RDW 14.0 04/26/2023 2024    and radiology: Ultrasound: MFM 04/27/23  Treatments: surgery: cervical cerclage  Discharge Exam: Blood pressure 111/75, pulse 63, temperature 98 F (36.7 C), resp. rate 16, height 5\' 5"  (1.651 m), weight 86.2 kg, last menstrual period 12/08/2022,  SpO2 100%. General appearance: alert and no distress Resp: normal respiratory effort Cardio: regular rate and rhythm GI: soft, non-tender Skin: Skin color, texture, turgor normal. No rashes or lesions  Disposition: Discharge disposition: 01-Home or Self Care      Follow Up: Maternal Fetal Medicine Dr. Judeth Cornfield for ultrasound at 2:15 PM  Discharge Instructions     Call MD for:  difficulty breathing, headache or visual disturbances   Complete by: As directed    Call MD for:  extreme fatigue   Complete by: As directed    Call MD for:  hives   Complete by: As directed    Call MD for:  persistant dizziness or light-headedness   Complete by: As directed    Call MD for:  persistant nausea and vomiting   Complete by: As directed    Call MD for:  redness, tenderness, or signs of infection (pain, swelling, redness, odor or green/yellow discharge around incision site)   Complete by: As directed    Call MD for:  severe uncontrolled pain   Complete by: As directed    Call MD for:  temperature >100.4   Complete by: As directed    Diet general   Complete by: As directed    Discharge instructions   Complete by: As directed    Please call office with any vaginal bleeding saturating a pad, increasing abdominal pain or cramping, or any other concerns. Follow up with Dr. Zannie Kehr office 05/11/23 at 2:15 PM.   Increase activity slowly   Complete by: As directed    Lifting restrictions   Complete by: As directed  No heavy lifting. 10 pound weight limit.   Sexual Activity Restrictions   Complete by: As directed    Nothing inside the vagina except prescribed medication as instructed. No sex.      Allergies as of 04/27/2023       Reactions   Amoxicillin Swelling        Medication List     STOP taking these medications    diphenhydrAMINE 25 mg capsule Commonly known as: BENADRYL   etonogestrel 68 MG Impl implant Commonly known as: NEXPLANON   nitrofurantoin  (macrocrystal-monohydrate) 100 MG capsule Commonly known as: MACROBID   predniSONE 10 MG tablet Commonly known as: DELTASONE         Signed: Lucian Baswell A Sharlett Lienemann 04/27/2023, 5:04 PM

## 2023-04-27 NOTE — Anesthesia Preprocedure Evaluation (Addendum)
Anesthesia Evaluation  Patient identified by MRN, date of birth, ID band Patient awake    Reviewed: Allergy & Precautions, NPO status , Patient's Chart, lab work & pertinent test results  Airway Mallampati: II  TM Distance: >3 FB Neck ROM: Full    Dental no notable dental hx. (+) Dental Advisory Given, Teeth Intact   Pulmonary Current Smoker   Pulmonary exam normal breath sounds clear to auscultation       Cardiovascular negative cardio ROS Normal cardiovascular exam Rhythm:Regular Rate:Normal     Neuro/Psych negative neurological ROS     GI/Hepatic negative GI ROS, Neg liver ROS,,,  Endo/Other  negative endocrine ROS    Renal/GU negative Renal ROS     Musculoskeletal negative musculoskeletal ROS (+)    Abdominal  (+) + obese  Peds  Hematology negative hematology ROS (+)   Anesthesia Other Findings   Reproductive/Obstetrics (+) Pregnancy                             Anesthesia Physical Anesthesia Plan  ASA: 2  Anesthesia Plan: Spinal   Post-op Pain Management: Tylenol PO (pre-op)*   Induction:   PONV Risk Score and Plan: 2 and Ondansetron, Treatment may vary due to age or medical condition and Scopolamine patch - Pre-op  Airway Management Planned:   Additional Equipment:   Intra-op Plan:   Post-operative Plan:   Informed Consent: I have reviewed the patients History and Physical, chart, labs and discussed the procedure including the risks, benefits and alternatives for the proposed anesthesia with the patient or authorized representative who has indicated his/her understanding and acceptance.     Dental advisory given  Plan Discussed with: CRNA  Anesthesia Plan Comments:        Anesthesia Quick Evaluation

## 2023-04-27 NOTE — Brief Op Note (Addendum)
04/27/2023  4:35 PM  PATIENT:  Natasha Gill  27 y.o. female  PRE-OPERATIVE DIAGNOSIS:  20w gestation with Cervical insufficiency.  POST-OPERATIVE DIAGNOSIS:  Same with rescue cerclage.  PROCEDURE:  Procedure(s): CERCLAGE CERVICAL (N/A)  SURGEON:  Surgeons and Role:    * Noralee Space, MD - Primary    * Law, Cassandra A, DO - Assisting  PHYSICIAN ASSISTANT:   ASSISTANTS: None   ANESTHESIA:   spinal  EBL:  20 milliliters   BLOOD ADMINISTERED:none  DRAINS: none   LOCAL MEDICATIONS USED:  NONE  SPECIMEN:  No Specimen  DISPOSITION OF SPECIMEN:  N/A  COUNTS:  YES  TOURNIQUET:  * No tourniquets in log *  DICTATION: .Note written in EPIC  PLAN OF CARE: Discharge to home after PACU  PATIENT DISPOSITION:  PACU - hemodynamically stable.   Delay start of Pharmacological VTE agent (>24hrs) due to surgical blood loss or risk of bleeding: not applicable

## 2023-04-27 NOTE — Transfer of Care (Signed)
Immediate Anesthesia Transfer of Care Note  Patient: Natasha Gill  Procedure(s) Performed: CERCLAGE CERVICAL  Patient Location: PACU  Anesthesia Type:Spinal  Level of Consciousness: awake  Airway & Oxygen Therapy: Patient Spontanous Breathing  Post-op Assessment: Report given to RN and Post -op Vital signs reviewed and stable  Post vital signs: Reviewed and stable  Last Vitals:  Vitals Value Taken Time  BP    Temp    Pulse    Resp    SpO2      Last Pain:  Vitals:   04/27/23 0821  TempSrc: Oral  PainSc:       Patients Stated Pain Goal: 3 (04/26/23 1836)  Complications: No notable events documented.

## 2023-04-27 NOTE — Anesthesia Procedure Notes (Addendum)
Spinal  Patient location during procedure: OR Start time: 04/27/2023 2:58 PM End time: 04/27/2023 3:02 PM Reason for block: surgical anesthesia Staffing Performed: anesthesiologist  Anesthesiologist: Lewie Loron, MD Performed by: Lewie Loron, MD Authorized by: Lewie Loron, MD   Preanesthetic Checklist Completed: patient identified, IV checked, site marked, risks and benefits discussed, surgical consent, monitors and equipment checked, pre-op evaluation and timeout performed Spinal Block Patient position: sitting Prep: DuraPrep and site prepped and draped Patient monitoring: heart rate, continuous pulse ox and blood pressure Approach: midline Location: L4-5 Injection technique: single-shot Needle Needle type: Spinocan  Needle gauge: 25 G Needle length: 9 cm Additional Notes Expiration date of kit checked and confirmed. Patient tolerated procedure well, without complications.

## 2023-04-27 NOTE — Progress Notes (Signed)
Patient ID: Natasha Gill, female   DOB: 1996-02-17, 27 y.o.   MRN: [id number] Pt seen this morning. No cramping pressure or LOF or vb   + FMs  BP 114/69 (BP Location: Left Arm)   Pulse 87   Temp 98.3 F (36.8 C) (Oral)   Resp 16   Ht 5\' 5"  (1.651 m)   Wt 86.2 kg   LMP 12/08/2022   SpO2 100%   BMI 31.62 kg/m  FHT 150s Abdo soft, non tender Extr no c/c/e Pt has hair follicle infection and a boil in left groin, she is getting Clindamycin in OR for cerclage (Amox caused lips to swell, so defer Ancef) and will need to contin PO Clindamycin for 7 days   MFM Dr Grace Bushy did sono and reviewed findings with me. Dynamic cervix becomes short, membranes still in upper cervix on imaging but no outside the os. Recommends emergency cerclage and add vaginal Prometrium 200 mg from tomorrow Spoke with Dr Judeth Cornfield. Cerclage time available for afternoon and he can operate then.  Pt advised that Dr Judeth Cornfield will be performing this emergency cerclage, scheduled for 2.30 pm and Dr Conni Elliot to assist and do post op care  May have discharge home tonight or tomorrow, final plan after surgery   Procedure/ r/c/b dw pt and small risk of membranes rupture and loss of pregnancy. She understands and given informed written consent   --Archana Eckman MD

## 2023-04-27 NOTE — Op Note (Signed)
Maternal-Fetal Medicine   Name: Natasha Gill DOB: 1996-01-21 MRN: [id number] Procedure: Rescue cerclage   PRE-OPERATIVE DIAGNOSIS:  20w gestation with cervical insufficiency  POST-OPERATIVE DIAGNOSIS: Same with rescue cerclage   PROCEDURE:  Rescue Cerclage   SURGEON:  Surgeon(s) and Role:  ** Noralee Space, MD - Primary   *Clance Boll, DO - Assisting Anesthesia: Spinal  I discussed cerclage procedure and possible complications in detail before obtaining informed consent.  After informed consent, the patient was taken to the OR and spinal anesthesia was administered.   The patient was prepped and draped in dorsal lithotomy position. On sterile speculum examination, the external os was closed.  A bivalve speculum was inserted.  The cervix was about 1.5 cm long and appeared healthy.  External os was closed. Vagina and cervix were cleaned with betadine. The anterior lip of the cervix was held by ring forceps.  With help of Bovie, a small incision of about 2 cm was made at the cervico-vesical junction, and the bladder was gently reflected upward.    A Mersilene tape 5 stitch was placed circumferentially starting at 11 o'clock position and around the cervix and exited close to 1 o'clock position.  The suture was tied and 5 secure knots were placed. The external os was firmly closed.  Good hemostasis was obtained. EBL 20 milliliters.  Straight catheter showed clear urine. Rectal examination was performed to ensure no inadvertent vaginal stitch was taken.  I reassured the patient of successful procedure.  I recommended that she take vaginal progesterone 200 mg from tomorrow night till [redacted] weeks gestation.  Recommendation -An appointment was made for her to return to our office on 05/11/23 at 2:15 PM. -Vaginal progesterone 200 milligrams daily from tomorrow till 36 weeks' gestation.

## 2023-04-27 NOTE — Progress Notes (Signed)
Maternal-Fetal Medicine  G2 P0010 at 20-weeks' gestation is admitted for rescue cerclage. I will be performing the procedure at Dr. Camillia Herter request. Patient agreed for the procedure to be performed by me.  See MFM consultation by Dr. Grace Bushy for details.  I explained cerclage procedure including bladder reflection with help of diagrams. Possible complications include bleeding, infection, injuries to bladder/bowel, miscarriage (all rare).  I counseled her that cerclage does not guarantee carrying pregnancy to term.  I recommended that she take vaginal progesterone 200 milligrams daily from tomorrow till 36-weeks' gestation.  Patient agreed to have the procedure.  Informed consent was obtained.

## 2023-04-27 NOTE — Consult Note (Signed)
MFM Consultation  Natasha Gill is a 27 yo G2P0 who is 20 w 0 d with an EDD of 1/1/205. She is seen today at the request of Dr. Ernestine Conrad due to cervical incompetence.   Natasha Gill reports that her overall prenatal care is was uncomplicated. She a has a low risk NIPT.   She denies s/sx of preterm labor such as pressure or uterine contractions.   She reports that she was examined in clinic yesterday an noted to have a mildly dilated cervix where membranes can be visualized through the internal os. .  She was admitted to the hospital for evaluation and consideration for ultrasound indicated cerclage placement.      04/27/2023    8:21 AM 04/27/2023    4:56 AM 04/26/2023    7:48 PM  Vitals with BMI  Height   5\' 5"   Weight   190 lbs  BMI   31.62  Systolic 114 106 161  Diastolic 69 58 63  Pulse 87 82 84      Latest Ref Rng & Units 04/26/2023    8:24 PM  CBC  WBC 4.0 - 10.5 K/uL 11.6   Hemoglobin 12.0 - 15.0 g/dL 9.3   Hematocrit 09.6 - 46.0 % 29.7   Platelets 150 - 400 K/uL 212       Latest Ref Rng & Units 04/26/2023    8:24 PM  CMP  Glucose 70 - 99 mg/dL 045   BUN 6 - 20 mg/dL 10   Creatinine 4.09 - 1.00 mg/dL 8.11   Sodium 914 - 782 mmol/L 134   Potassium 3.5 - 5.1 mmol/L 3.8   Chloride 98 - 111 mmol/L 101   CO2 22 - 32 mmol/L 23   Calcium 8.9 - 10.3 mg/dL 9.1   Total Protein 6.5 - 8.1 g/dL 6.2   Total Bilirubin 0.3 - 1.2 mg/dL 0.3   Alkaline Phos 38 - 126 U/L 47   AST 15 - 41 U/L 22   ALT 0 - 44 U/L 28    OB History  Gravida Para Term Preterm AB Living  2       1    SAB IAB Ectopic Multiple Live Births    1          # Outcome Date GA Lbr Len/2nd Weight Sex Type Anes PTL Lv  2 Current           1 IAB            Past Medical History:  Diagnosis Date   BV (bacterial vaginosis) 11/28/2014   Incompetent cervix in pregnancy, antepartum, second trimester 04/26/2023   Nexplanon in place 11/28/2014   Vaginal discharge 11/28/2014   Past Surgical History:  Procedure  Laterality Date   INDUCED ABORTION     Family History  Problem Relation Age of Onset   Hypertension Mother    Diabetes Father    Miscarriages / [place] Sister    Cancer Maternal Aunt    Cancer Paternal Aunt    Cancer Maternal Grandmother    Social History   Socioeconomic History   Marital status: Single    Spouse name: Not on file   Number of children: Not on file   Years of education: Not on file   Highest education level: Not on file  Occupational History   Not on file  Tobacco Use   Smoking status: Every Day    Current packs/day: 0.50    Average packs/day: 0.5 packs/day for 2.0  years (1.0 ttl pk-yrs)    Types: Cigarettes   Smokeless tobacco: Never  Substance and Sexual Activity   Alcohol use: No   Drug use: No   Sexual activity: Yes  Other Topics Concern   Not on file  Social History Narrative   Not on file   Social Determinants of Health   Financial Resource Strain: Not on file  Food Insecurity: No Food Insecurity (04/26/2023)   Hunger Vital Sign    Worried About Running Out of Food in the Last Year: Never true    Ran Out of Food in the Last Year: Never true  Transportation Needs: No Transportation Needs (04/26/2023)   PRAPARE - Administrator, Civil Service (Medical): No    Lack of Transportation (Non-Medical): No  Physical Activity: Not on file  Stress: Not on file  Social Connections: Unknown (01/26/2022)   [facility]   Social Network    Social Network: Not on file  Intimate Partner Violence: Not At Risk (04/26/2023)   Humiliation, Afraid, Rape, and Kick questionnaire    Fear of Current or Ex-Partner: No    Emotionally Abused: No    Physically Abused: No    Sexually Abused: No          Current Facility-Administered Medications (Analgesics):    acetaminophen (TYLENOL) tablet 650 mg     Current Facility-Administered Medications (Other):    calcium carbonate (TUMS - dosed in mg elemental calcium)  chewable tablet 400 mg of elemental calcium   clindamycin (CLEOCIN) IVPB 900 mg   docusate sodium (COLACE) capsule 100 mg   prenatal multivitamin tablet 1 tablet  No current outpatient medications on file. Allergies  Allergen Reactions   Amoxicillin Swelling    Imaging: Single intrauterine pregnancy  Limited exam to assess the cervix. Good fetal movement and amniotic fluid The cervix is dynamic at 0.76 cm to 1.26  cm  Impression/Counseling:   Traditionally, cervical insufficiency has been defined by four historical criteria: (1) painless cervical dilation, leading to (2) recurrent, (3) second trimester births in the (4) absence of  other cause.   Today's sonogram was remarkable for an incidentally noted shortened cervix of 7.6 mm. This finding increases your patient's risk for preterm delivery in this pregnancy regardless of her obstetrical history. We discussed the limitations of cervical length measurements in identifying those who will ultimately delivery preterm. Treatment with vaginal progesterone nightly has been shown to significantly reduce the risk of preterm delivery in women with a shortened cervix. In addition, we discussed that those that demonstrate mild cervical dilation are an higher risk for preterm delivery as compared to those with a shortened cervix without cervical dilation.  Thus a cerclage is warranted for consideration.  After discussion of the risk and benefits of this treatment, your patient made an informed decision to undergo a ultrasound indicated cerclage and vaginal progesterone.  I discussed this plan of care with Dr. Juliene Pina who will initiate antibiotics and post a case for cerclage.   All questions answered.  I spent 45 minutes with  > 50% in face to face consultation.  Novella Olive, MD

## 2023-04-27 NOTE — Progress Notes (Signed)
Gonorrhea/ chlamydia neg 02/22/23 All prenatal labs are normal  NIPT low risk

## 2023-04-28 ENCOUNTER — Encounter (HOSPITAL_COMMUNITY): Payer: Self-pay | Admitting: Obstetrics and Gynecology

## 2023-04-28 NOTE — Anesthesia Postprocedure Evaluation (Signed)
Anesthesia Post Note  Patient: Natasha Gill  Procedure(s) Performed: CERCLAGE CERVICAL     Patient location during evaluation: PACU Anesthesia Type: Spinal Level of consciousness: awake and alert Pain management: pain level controlled Vital Signs Assessment: post-procedure vital signs reviewed and stable Respiratory status: spontaneous breathing Cardiovascular status: stable Anesthetic complications: no   No notable events documented.  Last Vitals:  Vitals:   04/27/23 1700 04/27/23 1719  BP: 111/75 113/80  Pulse:  75  Resp: (!) 22 17  Temp: 36.8 C   SpO2:  100%    Last Pain:  Vitals:   04/27/23 1700  TempSrc: Oral  PainSc:                  Lewie Loron

## 2023-04-29 ENCOUNTER — Other Ambulatory Visit: Payer: Self-pay | Admitting: *Deleted

## 2023-04-29 DIAGNOSIS — O3430 Maternal care for cervical incompetence, unspecified trimester: Secondary | ICD-10-CM

## 2023-05-04 ENCOUNTER — Encounter: Payer: Self-pay | Admitting: *Deleted

## 2023-05-04 DIAGNOSIS — O3430 Maternal care for cervical incompetence, unspecified trimester: Secondary | ICD-10-CM | POA: Insufficient documentation

## 2023-05-11 ENCOUNTER — Ambulatory Visit: Payer: No Typology Code available for payment source | Admitting: *Deleted

## 2023-05-11 ENCOUNTER — Other Ambulatory Visit: Payer: Self-pay | Admitting: Obstetrics and Gynecology

## 2023-05-11 ENCOUNTER — Encounter: Payer: Self-pay | Admitting: *Deleted

## 2023-05-11 ENCOUNTER — Ambulatory Visit: Payer: No Typology Code available for payment source | Attending: Obstetrics and Gynecology

## 2023-05-11 VITALS — BP 114/64 | HR 96

## 2023-05-11 DIAGNOSIS — O3430 Maternal care for cervical incompetence, unspecified trimester: Secondary | ICD-10-CM

## 2023-05-11 DIAGNOSIS — O3432 Maternal care for cervical incompetence, second trimester: Secondary | ICD-10-CM

## 2023-05-11 DIAGNOSIS — Z3A22 22 weeks gestation of pregnancy: Secondary | ICD-10-CM

## 2023-08-18 LAB — OB RESULTS CONSOLE GBS: GBS: NEGATIVE

## 2023-09-02 ENCOUNTER — Other Ambulatory Visit: Payer: Self-pay | Admitting: Obstetrics and Gynecology

## 2023-09-06 ENCOUNTER — Inpatient Hospital Stay (HOSPITAL_COMMUNITY)
Admission: AD | Admit: 2023-09-06 | Discharge: 2023-09-06 | Disposition: A | Discharge status: Home / Self Care | Payer: No Typology Code available for payment source | Source: Home / Self Care | Attending: Obstetrics & Gynecology | Admitting: Obstetrics & Gynecology

## 2023-09-06 ENCOUNTER — Encounter (HOSPITAL_COMMUNITY): Payer: Self-pay | Admitting: Obstetrics & Gynecology

## 2023-09-06 ENCOUNTER — Other Ambulatory Visit: Payer: Self-pay

## 2023-09-06 DIAGNOSIS — R030 Elevated blood-pressure reading, without diagnosis of hypertension: Secondary | ICD-10-CM | POA: Insufficient documentation

## 2023-09-06 DIAGNOSIS — O471 False labor at or after 37 completed weeks of gestation: Secondary | ICD-10-CM | POA: Insufficient documentation

## 2023-09-06 DIAGNOSIS — O3432 Maternal care for cervical incompetence, second trimester: Secondary | ICD-10-CM

## 2023-09-06 DIAGNOSIS — O479 False labor, unspecified: Secondary | ICD-10-CM

## 2023-09-06 DIAGNOSIS — Z3A38 38 weeks gestation of pregnancy: Secondary | ICD-10-CM | POA: Insufficient documentation

## 2023-09-06 DIAGNOSIS — O26893 Other specified pregnancy related conditions, third trimester: Secondary | ICD-10-CM | POA: Diagnosis not present

## 2023-09-06 LAB — COMPREHENSIVE METABOLIC PANEL
ALT: 13 U/L (ref 0–44)
AST: 16 U/L (ref 15–41)
Albumin: 2.8 g/dL — ABNORMAL LOW (ref 3.5–5.0)
Alkaline Phosphatase: 157 U/L — ABNORMAL HIGH (ref 38–126)
Anion gap: 12 (ref 5–15)
BUN: 10 mg/dL (ref 6–20)
CO2: 21 mmol/L — ABNORMAL LOW (ref 22–32)
Calcium: 9.4 mg/dL (ref 8.9–10.3)
Chloride: 104 mmol/L (ref 98–111)
Creatinine, Ser: 0.79 mg/dL (ref 0.44–1.00)
GFR, Estimated: >60 mL/min (ref >60)
Glucose, Bld: 116 mg/dL — ABNORMAL HIGH (ref 70–99)
Potassium: 4.1 mmol/L (ref 3.5–5.1)
Sodium: 137 mmol/L (ref 135–145)
Total Bilirubin: 0.5 mg/dL (ref <1.2)
Total Protein: 5.7 g/dL — ABNORMAL LOW (ref 6.5–8.1)

## 2023-09-06 LAB — CBC
HCT: 33.1 % — ABNORMAL LOW (ref 36.0–46.0)
Hemoglobin: 10.3 g/dL — ABNORMAL LOW (ref 12.0–15.0)
MCH: 23.8 pg — ABNORMAL LOW (ref 26.0–34.0)
MCHC: 31.1 g/dL (ref 30.0–36.0)
MCV: 76.4 fL — ABNORMAL LOW (ref 80.0–100.0)
Platelets: 181 10*3/uL (ref 150–400)
RBC: 4.33 MIL/uL (ref 3.87–5.11)
RDW: 15.9 % — ABNORMAL HIGH (ref 11.5–15.5)
WBC: 11.9 10*3/uL — ABNORMAL HIGH (ref 4.0–10.5)
nRBC: 0 % (ref 0.0–0.2)

## 2023-09-06 LAB — PROTEIN / CREATININE RATIO, URINE
Creatinine, Urine: 115 mg/dL
Protein Creatinine Ratio: 0.1 mg/mg{creat} (ref 0.00–0.15)
Total Protein, Urine: 12 mg/dL

## 2023-09-06 MED ORDER — PROMETHAZINE HCL 25 MG PO TABS
25.0000 mg | ORAL_TABLET | Freq: Once | ORAL | Status: AC
Start: 2023-09-06 — End: 2023-09-06
  Administered 2023-09-06: 25 mg via ORAL
  Filled 2023-09-06: qty 1

## 2023-09-06 MED ORDER — MORPHINE SULFATE (PF) 4 MG/ML IV SOLN
4.0000 mg | Freq: Once | INTRAVENOUS | Status: AC
Start: 2023-09-06 — End: 2023-09-06
  Administered 2023-09-06: 4 mg via INTRAMUSCULAR
  Filled 2023-09-06: qty 1

## 2023-09-06 NOTE — MAU Note (Signed)
Natasha Gill is a 27 y.o. at [redacted]w[redacted]d here in MAU reporting: she's having ctxs that are 3 minutes apart, ctxs started @ 0530 this morning.  Denies VB or LOF.  Endorses +FM.  LMP: NA Onset of complaint: today Pain score: 9 Vitals:   09/06/23 0814  BP: (!) 143/81  Pulse: 95  Resp: 19  Temp: 97.7 F (36.5 C)  SpO2: 100%     FHT:135 bpm Lab orders placed from triage: None

## 2023-09-06 NOTE — MAU Provider Note (Signed)
History     CSN: [id number]  Arrival date and time: 09/06/23 4696   Event Date/Time   First Provider Initiated Contact with Patient 09/06/2023  8:13 AM   Chief Complaint  Patient presents with   Contractions    HPI  Natasha Gill is a 27 y.o. G2P0010 at [redacted]w[redacted]d who presents to the MAU for contractions. Started at 0530, happening every 3 minutes and increasing in intensity. No LOF, VB. Good FM.  Initially had elevated BP on arrival, no prior hx HTN or gHTN. No sxs.  Past Medical History:  Diagnosis Date   ANKLE PAIN, RIGHT 11/19/2008   Qualifier: Diagnosis of   By: Romeo Apple MD, [place]         BV (bacterial vaginosis) 11/28/2014   Cystitis 11/11/2015   FOOT PAIN 12/12/2008   Qualifier: Diagnosis of   By: Romeo Apple MD, Stanley         Incompetent cervix in pregnancy, antepartum, second trimester 04/26/2023   JOINT EFFUSION, LEFT KNEE 11/19/2008   Qualifier: Diagnosis of   By: Romeo Apple MD, Ginger Carne in place 11/28/2014   Vaginal discharge 11/28/2014   Vaginal Pap smear, abnormal     Past Surgical History:  Procedure Laterality Date   CERVICAL CERCLAGE N/A 04/27/2023   Procedure: CERCLAGE CERVICAL;  Surgeon: Noralee Space, MD;  Location: [facility] LD [facility]S;  Service: Gynecology;  Laterality: N/A;   INDUCED ABORTION      Family History  Problem Relation Age of Onset   Hypertension Mother    Diabetes Father    Miscarriages / [place] Sister    Cancer Maternal Aunt    Cancer Paternal Aunt    Cancer Maternal Grandmother     Social History   Tobacco Use   Smoking status: Every Day    Current packs/day: 0.50    Average packs/day: 0.5 packs/day for 2.0 years (1.0 ttl pk-yrs)    Types: Cigarettes   Smokeless tobacco: Never  Vaping Use   Vaping status: Former   Substances: Nicotine  Substance Use Topics   Alcohol use: No   Drug use: No    Allergies:  Allergies  Allergen Reactions   Amoxicillin Swelling    Medications Prior to Admission   Medication Sig Dispense Refill Last Dose/Taking   Prenatal Vit-Fe Fumarate-FA (PRENATAL MULTIVITAMIN) TABS tablet Take 1 tablet by mouth daily at 12 noon.   09/05/2023    ROS reviewed and pertinent positives and negatives as documented in HPI.  Physical Exam   Blood pressure 120/84, pulse 90, temperature 97.7 F (36.5 C), temperature source Oral, resp. rate 19, height 5\' 5"  (1.651 m), weight 107.5 kg, last menstrual period 12/08/2022, SpO2 100%.  Physical Exam Constitutional:      General: She is not in acute distress.    Appearance: Normal appearance. She is not ill-appearing.  HENT:     Head: Normocephalic and atraumatic.  Cardiovascular:     Rate and Rhythm: Normal rate.  Pulmonary:     Effort: Pulmonary effort is normal.     Breath sounds: Normal breath sounds.  Abdominal:     Palpations: Abdomen is soft.  Musculoskeletal:        General: Normal range of motion.  Skin:    General: Skin is warm and dry.     Findings: No rash.  Neurological:     General: No focal deficit present.     Mental Status: She is alert and oriented to person, place, and  time.  EFM: 145/mod/+a/-d  MAU Course  Procedures  MDM 27 y.o. G2P0010 at [redacted]w[redacted]d presenting for contractions in setting of [redacted] weeks gestation. She has a known hx incompetent cervix s/p removal of cerclage. Her cervix has changed from 1cm in clinic to 2cm, but was unchanged on repeat checks during her admission to MAU. She has an initial elevated BP, but on recheck, BP has been normotensive and PEC w/up was unremarkable. She had a reactive NST. Discussed therapeutic rest -- pt given Morphine and Phenergan. Labor precautions discussed in detail with patient. Patient discussed with Dr. Juliene Pina, who agreed with therapeutic rest and discharge.    Assessment and Plan  False labor - Plan: Discharge patient NST reactive Cx unchanged Therapeutic rest -- morphine and phenergan Discharged home w labor precautions  Sundra Aland, MD OB  Fellow, Faculty Practice [facility], [facility]  09/06/2023, 11:17 AM

## 2023-09-06 NOTE — MAU Note (Signed)
RN called lab to inquire about  protein creatinine ratio . Lab tech answered and informed RN that that they have received sample and will process now. and that they will try to locate the sample and contact the RN if they are unable to locate.Marland Kitchen

## 2023-09-07 ENCOUNTER — Encounter (HOSPITAL_COMMUNITY): Payer: Self-pay | Admitting: Obstetrics & Gynecology

## 2023-09-07 ENCOUNTER — Inpatient Hospital Stay (HOSPITAL_COMMUNITY): Payer: No Typology Code available for payment source | Admitting: Anesthesiology

## 2023-09-07 ENCOUNTER — Other Ambulatory Visit: Payer: Self-pay

## 2023-09-07 ENCOUNTER — Inpatient Hospital Stay (HOSPITAL_COMMUNITY)
Admission: AD | Admit: 2023-09-07 | Discharge: 2023-09-09 | DRG: 806 | Disposition: A | Discharge status: Home / Self Care | Payer: No Typology Code available for payment source | Attending: Obstetrics and Gynecology | Admitting: Obstetrics and Gynecology

## 2023-09-07 DIAGNOSIS — Z8249 Family history of ischemic heart disease and other diseases of the circulatory system: Secondary | ICD-10-CM

## 2023-09-07 DIAGNOSIS — O99334 Smoking (tobacco) complicating childbirth: Secondary | ICD-10-CM | POA: Diagnosis present

## 2023-09-07 DIAGNOSIS — D509 Iron deficiency anemia, unspecified: Secondary | ICD-10-CM | POA: Insufficient documentation

## 2023-09-07 DIAGNOSIS — Z880 Allergy status to penicillin: Secondary | ICD-10-CM

## 2023-09-07 DIAGNOSIS — O99214 Obesity complicating childbirth: Secondary | ICD-10-CM | POA: Diagnosis present

## 2023-09-07 DIAGNOSIS — Z3A39 39 weeks gestation of pregnancy: Secondary | ICD-10-CM

## 2023-09-07 DIAGNOSIS — F17210 Nicotine dependence, cigarettes, uncomplicated: Secondary | ICD-10-CM | POA: Diagnosis present

## 2023-09-07 DIAGNOSIS — O9081 Anemia of the puerperium: Secondary | ICD-10-CM | POA: Diagnosis not present

## 2023-09-07 DIAGNOSIS — D62 Acute posthemorrhagic anemia: Secondary | ICD-10-CM | POA: Diagnosis not present

## 2023-09-07 DIAGNOSIS — Z833 Family history of diabetes mellitus: Secondary | ICD-10-CM | POA: Diagnosis not present

## 2023-09-07 DIAGNOSIS — O26893 Other specified pregnancy related conditions, third trimester: Secondary | ICD-10-CM | POA: Diagnosis present

## 2023-09-07 DIAGNOSIS — Z3490 Encounter for supervision of normal pregnancy, unspecified, unspecified trimester: Secondary | ICD-10-CM | POA: Diagnosis present

## 2023-09-07 DIAGNOSIS — O3432 Maternal care for cervical incompetence, second trimester: Principal | ICD-10-CM | POA: Diagnosis present

## 2023-09-07 LAB — TYPE AND SCREEN
ABO/RH(D): A POS
Antibody Screen: NEGATIVE

## 2023-09-07 LAB — CBC
HCT: 34 % — ABNORMAL LOW (ref 36.0–46.0)
Hemoglobin: 10.6 g/dL — ABNORMAL LOW (ref 12.0–15.0)
MCH: 23.7 pg — ABNORMAL LOW (ref 26.0–34.0)
MCHC: 31.2 g/dL (ref 30.0–36.0)
MCV: 76.1 fL — ABNORMAL LOW (ref 80.0–100.0)
Platelets: 188 10*3/uL (ref 150–400)
RBC: 4.47 MIL/uL (ref 3.87–5.11)
RDW: 15.9 % — ABNORMAL HIGH (ref 11.5–15.5)
WBC: 12.1 10*3/uL — ABNORMAL HIGH (ref 4.0–10.5)
nRBC: 0 % (ref 0.0–0.2)

## 2023-09-07 LAB — COMPREHENSIVE METABOLIC PANEL
ALT: 13 U/L (ref 0–44)
AST: 15 U/L (ref 15–41)
Albumin: 3 g/dL — ABNORMAL LOW (ref 3.5–5.0)
Alkaline Phosphatase: 160 U/L — ABNORMAL HIGH (ref 38–126)
Anion gap: 9 (ref 5–15)
BUN: 12 mg/dL (ref 6–20)
CO2: 20 mmol/L — ABNORMAL LOW (ref 22–32)
Calcium: 9.4 mg/dL (ref 8.9–10.3)
Chloride: 106 mmol/L (ref 98–111)
Creatinine, Ser: 0.76 mg/dL (ref 0.44–1.00)
GFR, Estimated: >60 mL/min (ref >60)
Glucose, Bld: 106 mg/dL — ABNORMAL HIGH (ref 70–99)
Potassium: 4.1 mmol/L (ref 3.5–5.1)
Sodium: 135 mmol/L (ref 135–145)
Total Bilirubin: 0.4 mg/dL (ref <1.2)
Total Protein: 6.1 g/dL — ABNORMAL LOW (ref 6.5–8.1)

## 2023-09-07 LAB — PROTEIN / CREATININE RATIO, URINE
Creatinine, Urine: 57 mg/dL
Total Protein, Urine: <6 mg/dL

## 2023-09-07 LAB — RPR: RPR Ser Ql: NONREACTIVE

## 2023-09-07 MED ORDER — DIPHENHYDRAMINE HCL 25 MG PO CAPS: 25.0000 mg | ORAL_CAPSULE | Freq: Four times a day (QID) | ORAL | Status: DC | PRN

## 2023-09-07 MED ORDER — BENZOCAINE-MENTHOL 20-0.5 % EX AERO
1.0000 | INHALATION_SPRAY | CUTANEOUS | Status: DC | PRN
  Administered 2023-09-08: 1 via TOPICAL
  Filled 2023-09-07: qty 56

## 2023-09-07 MED ORDER — FAMOTIDINE IN NACL 20-0.9 MG/50ML-% IV SOLN
20.0000 mg | Freq: Once | INTRAVENOUS | Status: AC
  Administered 2023-09-07: 20 mg via INTRAVENOUS
  Filled 2023-09-07: qty 50

## 2023-09-07 MED ORDER — ZOLPIDEM TARTRATE 5 MG PO TABS
5.0000 mg | ORAL_TABLET | Freq: Every evening | ORAL | Status: DC | PRN
Start: 2023-09-07 — End: 2023-09-09

## 2023-09-07 MED ORDER — LACTATED RINGERS IV SOLN
500.0000 mL | INTRAVENOUS | Status: DC | PRN
  Administered 2023-09-07: 1000 mL via INTRAVENOUS

## 2023-09-07 MED ORDER — ONDANSETRON HCL 4 MG PO TABS: 4.0000 mg | ORAL_TABLET | ORAL | Status: DC | PRN

## 2023-09-07 MED ORDER — PRENATAL MULTIVITAMIN CH
1.0000 | ORAL_TABLET | Freq: Every day | ORAL | Status: DC
  Administered 2023-09-08 – 2023-09-09 (×2): 1 via ORAL
  Filled 2023-09-07 (×2): qty 1

## 2023-09-07 MED ORDER — FENTANYL-BUPIVACAINE-NACL 0.5-0.125-0.9 MG/250ML-% EP SOLN: 12.0000 mL/h | EPIDURAL | Status: DC | PRN

## 2023-09-07 MED ORDER — ACETAMINOPHEN 325 MG PO TABS: 650.0000 mg | ORAL_TABLET | ORAL | Status: DC | PRN

## 2023-09-07 MED ORDER — SIMETHICONE 80 MG PO CHEW: 80.0000 mg | CHEWABLE_TABLET | ORAL | Status: DC | PRN

## 2023-09-07 MED ORDER — OXYTOCIN-SODIUM CHLORIDE 30-0.9 UT/500ML-% IV SOLN
2.5000 [IU]/h | INTRAVENOUS | Status: DC
  Administered 2023-09-07: 2.5 [IU]/h via INTRAVENOUS

## 2023-09-07 MED ORDER — DIPHENHYDRAMINE HCL 50 MG/ML IJ SOLN: 12.5000 mg | INTRAMUSCULAR | Status: DC | PRN

## 2023-09-07 MED ORDER — OXYTOCIN-SODIUM CHLORIDE 30-0.9 UT/500ML-% IV SOLN
1.0000 m[IU]/min | INTRAVENOUS | Status: DC
  Administered 2023-09-07: 2 m[IU]/min via INTRAVENOUS
  Filled 2023-09-07: qty 500

## 2023-09-07 MED ORDER — COCONUT OIL OIL: 1.0000 | TOPICAL_OIL | Status: DC | PRN

## 2023-09-07 MED ORDER — FLEET ENEMA RE ENEM: 1.0000 | ENEMA | Freq: Every day | RECTAL | Status: DC | PRN

## 2023-09-07 MED ORDER — ONDANSETRON HCL 4 MG/2ML IJ SOLN: 4.0000 mg | Freq: Four times a day (QID) | INTRAMUSCULAR | Status: DC | PRN

## 2023-09-07 MED ORDER — OXYTOCIN-SODIUM CHLORIDE 30-0.9 UT/500ML-% IV SOLN: 2.5000 [IU]/h | INTRAVENOUS | Status: DC

## 2023-09-07 MED ORDER — EPHEDRINE 5 MG/ML INJ: 10.0000 mg | INTRAVENOUS | Status: DC | PRN

## 2023-09-07 MED ORDER — SOD CITRATE-CITRIC ACID 500-334 MG/5ML PO SOLN: 30.0000 mL | ORAL | Status: DC | PRN

## 2023-09-07 MED ORDER — IBUPROFEN 600 MG PO TABS
600.0000 mg | ORAL_TABLET | Freq: Four times a day (QID) | ORAL | Status: DC
  Administered 2023-09-07 – 2023-09-09 (×7): 600 mg via ORAL
  Filled 2023-09-07 (×7): qty 1

## 2023-09-07 MED ORDER — OXYCODONE-ACETAMINOPHEN 5-325 MG PO TABS: 1.0000 | ORAL_TABLET | ORAL | Status: DC | PRN

## 2023-09-07 MED ORDER — PHENYLEPHRINE 80 MCG/ML (10ML) SYRINGE FOR IV PUSH (FOR BLOOD PRESSURE SUPPORT): 80.0000 ug | PREFILLED_SYRINGE | INTRAVENOUS | Status: DC | PRN

## 2023-09-07 MED ORDER — SENNOSIDES-DOCUSATE SODIUM 8.6-50 MG PO TABS
2.0000 | ORAL_TABLET | Freq: Every day | ORAL | Status: DC
  Administered 2023-09-08 – 2023-09-09 (×2): 2 via ORAL
  Filled 2023-09-07 (×2): qty 2

## 2023-09-07 MED ORDER — LACTATED RINGERS IV SOLN: 500.0000 mL | INTRAVENOUS | Status: DC | PRN

## 2023-09-07 MED ORDER — ACETAMINOPHEN 325 MG PO TABS
650.0000 mg | ORAL_TABLET | ORAL | Status: DC | PRN
  Administered 2023-09-07: 650 mg via ORAL
  Filled 2023-09-07: qty 2

## 2023-09-07 MED ORDER — DIBUCAINE (PERIANAL) 1 % EX OINT: 1.0000 | TOPICAL_OINTMENT | CUTANEOUS | Status: DC | PRN

## 2023-09-07 MED ORDER — FENTANYL-BUPIVACAINE-NACL 0.5-0.125-0.9 MG/250ML-% EP SOLN
12.0000 mL/h | EPIDURAL | Status: DC | PRN
  Administered 2023-09-07: 12 mL/h via EPIDURAL
  Filled 2023-09-07: qty 250

## 2023-09-07 MED ORDER — OXYTOCIN BOLUS FROM INFUSION
333.0000 mL | Freq: Once | INTRAVENOUS | Status: AC
Start: 2023-09-07 — End: 2023-09-07
  Administered 2023-09-07: 333 mL via INTRAVENOUS

## 2023-09-07 MED ORDER — WITCH HAZEL-GLYCERIN EX PADS: 1.0000 | MEDICATED_PAD | CUTANEOUS | Status: DC | PRN

## 2023-09-07 MED ORDER — TETANUS-DIPHTH-ACELL PERTUSSIS 5-2.5-18.5 LF-MCG/0.5 IM SUSY: 0.5000 mL | PREFILLED_SYRINGE | Freq: Once | INTRAMUSCULAR | Status: DC

## 2023-09-07 MED ORDER — ONDANSETRON HCL 4 MG/2ML IJ SOLN: 4.0000 mg | INTRAMUSCULAR | Status: DC | PRN

## 2023-09-07 MED ORDER — LACTATED RINGERS IV SOLN: INTRAVENOUS | Status: DC

## 2023-09-07 MED ORDER — LIDOCAINE HCL (PF) 1 % IJ SOLN
INTRAMUSCULAR | Status: DC | PRN
  Administered 2023-09-07: 8 mL via EPIDURAL

## 2023-09-07 MED ORDER — LIDOCAINE HCL (PF) 1 % IJ SOLN: 30.0000 mL | INTRAMUSCULAR | Status: DC | PRN

## 2023-09-07 MED ORDER — ACETAMINOPHEN 325 MG PO TABS
650.0000 mg | ORAL_TABLET | ORAL | Status: DC | PRN
  Administered 2023-09-08 – 2023-09-09 (×3): 650 mg via ORAL
  Filled 2023-09-07 (×3): qty 2

## 2023-09-07 MED ORDER — OXYCODONE-ACETAMINOPHEN 5-325 MG PO TABS: 2.0000 | ORAL_TABLET | ORAL | Status: DC | PRN

## 2023-09-07 MED ORDER — LACTATED RINGERS IV SOLN: 500.0000 mL | Freq: Once | INTRAVENOUS | Status: DC

## 2023-09-07 MED ORDER — ONDANSETRON HCL 4 MG/2ML IJ SOLN
4.0000 mg | Freq: Four times a day (QID) | INTRAMUSCULAR | Status: DC | PRN
  Administered 2023-09-07 (×2): 4 mg via INTRAVENOUS
  Filled 2023-09-07 (×2): qty 2

## 2023-09-07 MED ORDER — OXYTOCIN BOLUS FROM INFUSION: 333.0000 mL | Freq: Once | INTRAVENOUS | Status: DC

## 2023-09-07 MED ORDER — TERBUTALINE SULFATE 1 MG/ML IJ SOLN: 0.2500 mg | Freq: Once | INTRAMUSCULAR | Status: DC | PRN

## 2023-09-07 NOTE — Progress Notes (Signed)
Discussed vaginal exam with Dr Juliene Pina (dr on unit)   Dr stated not to check patient patient every 2 hrs to use nurse's judgement

## 2023-09-07 NOTE — Anesthesia Procedure Notes (Signed)
Epidural Patient location during procedure: OB Start time: 09/07/2023 3:36 AM End time: 09/07/2023 3:40 AM  Staffing Anesthesiologist: Achille Rich, MD Performed: other anesthesia staff and anesthesiologist   Preanesthetic Checklist Completed: patient identified, IV checked, site marked, risks and benefits discussed, surgical consent, monitors and equipment checked, pre-op evaluation and timeout performed  Epidural Patient position: sitting Prep: DuraPrep and site prepped and draped Patient monitoring: continuous pulse ox and blood pressure Approach: midline Location: L4-L5 Injection technique: LOR air  Needle:  Needle type: Tuohy  Needle gauge: 17 G Needle length: 9 cm and 9 Needle insertion depth: 9 cm Catheter type: closed end flexible Catheter size: 19 Gauge Catheter at skin depth: 15 cm Test dose: negative  Assessment Events: blood not aspirated, no cerebrospinal fluid, injection not painful, no injection resistance, no paresthesia and negative IV test

## 2023-09-07 NOTE — Anesthesia Preprocedure Evaluation (Signed)
Anesthesia Evaluation  Patient identified by MRN, date of birth, ID band Patient awake    Reviewed: Allergy & Precautions, H&P , NPO status , Patient's Chart, lab work & pertinent test results, reviewed documented beta blocker date and time   Airway Mallampati: II  TM Distance: >3 FB Neck ROM: full    Dental no notable dental hx.    Pulmonary neg pulmonary ROS, Current Smoker   Pulmonary exam normal breath sounds clear to auscultation       Cardiovascular negative cardio ROS Normal cardiovascular exam Rhythm:regular Rate:Normal     Neuro/Psych negative neurological ROS  negative psych ROS   GI/Hepatic negative GI ROS, Neg liver ROS,,,  Endo/Other    Class 4 obesity  Renal/GU negative Renal ROS  negative genitourinary   Musculoskeletal   Abdominal   Peds  Hematology negative hematology ROS (+)   Anesthesia Other Findings   Reproductive/Obstetrics (+) Pregnancy                             Anesthesia Physical Anesthesia Plan  ASA: 3  Anesthesia Plan: Epidural   Post-op Pain Management: Minimal or no pain anticipated   Induction: Intravenous  PONV Risk Score and Plan: 2  Airway Management Planned: Natural Airway and Simple Face Mask  Additional Equipment: None  Intra-op Plan:   Post-operative Plan:   Informed Consent: I have reviewed the patients History and Physical, chart, labs and discussed the procedure including the risks, benefits and alternatives for the proposed anesthesia with the patient or authorized representative who has indicated his/her understanding and acceptance.     Dental Advisory Given  Plan Discussed with: Anesthesiologist and CRNA  Anesthesia Plan Comments: (Labs checked- platelets confirmed with RN in room. Fetal heart tracing, per RN, reported to be stable enough for sitting procedure. Discussed epidural, and patient consents to the procedure:   included risk of possible headache,backache, failed block, allergic reaction, and nerve injury. This patient was asked if she had any questions or concerns before the procedure started.)       Anesthesia Quick Evaluation

## 2023-09-07 NOTE — Progress Notes (Signed)
Natasha Gill is a 27 y.o. G2P0010 at [redacted]w[redacted]d labor admit. H/o emerg cerclage at 19 wks.  Came in labor, now in pitocin since 9 am  S/p AROM with meconium   Subjective: Feels some pressure.   Objective: BP 117/69   Pulse 84   Temp 97.9 F (36.6 C) (Oral)   Resp 18   Ht 5\' 5"  (1.651 m)   Wt 108.8 kg   LMP 12/08/2022   SpO2 100%   BMI 39.92 kg/m  I/O last 3 completed shifts: In: -  Out: 350 [Urine:350] Total I/O In: -  Out: 150 [Urine:150]  FHT:  FHR: 125 bpm, variability: moderate,  accelerations:  Present,  decelerations:  Absent  cat 1   UC:   regular per RN but now no tracing on side, pitocin at 6 mu  SVE:   Dilation: 5.5 Effacement (%): 100 Station: -1 Exam by:: Dr. Juliene Pina  Cervical change noted, lot of bloody show. IUPC placed since unable to pick up on sides.  ROT position , pelvis adequate, placed in left exaggerated Sims to allow rotation and descent  Contin pitocin per protocol GBS neg Working towards SVD  Pt advised Dr Conni Elliot will take over care at 1 pm   Robley Fries, MD 09/07/2023, 12:36 PM

## 2023-09-07 NOTE — H&P (Signed)
Natasha Gill is a 27 y.o. female presenting for labor. Prodromal labor for 2 days., now UCs stronger and more painful. No VB/ LOF. Some mucous + FMs G2P0010, [facility]. Started w/ CNM and switched to Dr Conni Elliot after diagnosis of cervical incompetence at 19 wk anatomy sono and needed emergency cerclage w/ Dr Judeth Cornfield. Pt also used vaginal Prometrium till 36 wks.  Cerclage removed in office at 36 wks.  Last sono 36.6 wks - 6'2" 28% Vx. GBS neg . OB History     Gravida  2   Para      Term      Preterm      AB  1   Living         SAB      IAB  1   Ectopic      Multiple      Live Births             Past Medical History:  Diagnosis Date   ANKLE PAIN, RIGHT 11/19/2008   Qualifier: Diagnosis of   By: Romeo Apple MD, Stanley         BV (bacterial vaginosis) 11/28/2014   Cystitis 11/11/2015   FOOT PAIN 12/12/2008   Qualifier: Diagnosis of   By: Romeo Apple MD, Stanley         Incompetent cervix in pregnancy, antepartum, second trimester 04/26/2023   JOINT EFFUSION, LEFT KNEE 11/19/2008   Qualifier: Diagnosis of   By: Romeo Apple MD, Ginger Carne in place 11/28/2014   Vaginal discharge 11/28/2014   Vaginal Pap smear, abnormal    Past Surgical History:  Procedure Laterality Date   CERVICAL CERCLAGE N/A 04/27/2023   Procedure: CERCLAGE CERVICAL;  Surgeon: Noralee Space, MD;  Location: [facility] LD ORS;  Service: Gynecology;  Laterality: N/A;   INDUCED ABORTION     Family History: family history includes Cancer in her maternal aunt, maternal grandmother, and paternal aunt; Diabetes in her father; Hypertension in her mother; Miscarriages / [place] in her sister. Social History:  reports that she has been smoking cigarettes. She has a 1 pack-year smoking history. She has never used smokeless tobacco. She reports that she does not drink alcohol and does not use drugs.     Maternal Diabetes: No Genetic Screening: Normal Maternal Ultrasounds/Referrals:  Normal Fetal Ultrasounds or other Referrals:  None Maternal Substance Abuse:  No Significant Maternal Medications:  vaginal prometrium  Significant Maternal Lab Results:  Group B Strep negative Number of Prenatal Visits:greater than 3 verified prenatal visits Maternal Vaccinations:TDap Other Comments:  None  Review of Systems History Blood pressure 92/63, pulse 84, temperature 97.8 F (36.6 C), temperature source Oral, resp. rate 20, height 5\' 5"  (1.651 m), weight 108.8 kg, last menstrual period 12/08/2022, SpO2 99%. Exam Physical Exam  Physical exam:  A&O x 3, no acute distress. Pleasant HEENT neg, no thyromegaly Lungs CTA bilat CV RRR, S1S2 normal Abdo soft, non tender, non acute Extr no edema/ tenderness Pelvic Dilation: 4.5/ 100 %/  -2   AROM. Meconium in fluid.  FHT  140-150s mod variability, no decels + accels. Cat I.  One prolonged decel noted around 6.45 am, no concerns since.  Toco now spaced out   Prenatal labs: ABO, Rh: --/--/A POS (12/25 0234) Antibody: NEG (12/25 0234) Rubella: Immune (06/12 0000) RPR: Nonreactive (06/12 0000)  HBsAg: Negative (06/12 0000)  HepC neg HIV: Non-reactive (06/12 0000)  GBS: Negative/-- (12/05 0000)   Assessment/Plan: 27 yo  G2P0 at 39 wks admitted in active labor. Cervical insufficiency diagnosed at 19 wks and had emergency cerclage.  Pt has epidural and is resting  UCs have spaced out and with not much cervical change, start pitocin. Pt accepts plan   FHT cat I ( one prolonged 3 min decel at 6.45 am, watch closely) EFW 7.1/2 lbs \ Working towards vaginal delivery    Robley Fries 09/07/2023, 8:38 AM

## 2023-09-07 NOTE — MAU Note (Addendum)
[name] is a 27 y.o. at [redacted]w[redacted]d here in MAU reporting: ctx all day that got more intense around 2200 tonight. Pt states they are every 3-4 minutes. Pt states she was in MAU this morning and was sent home at 2cm. Pt states she had some mucous come out and some bloody show. Pt denies LOF. +FM   Onset of complaint: 09/06/2023 Pain score: 10/10 lower abdomen  8/10 back  10/10 pressure in butt with ctx  Vitals:   09/07/23 0117  BP: 136/85  Pulse: (!) 101  Resp: 20  Temp: 98 F (36.7 C)  SpO2: 100%     FHT:155 Lab orders placed from triage:  mau labor

## 2023-09-08 ENCOUNTER — Encounter (HOSPITAL_COMMUNITY): Payer: Self-pay | Admitting: Obstetrics and Gynecology

## 2023-09-08 LAB — CBC
HCT: 28.4 % — ABNORMAL LOW (ref 36.0–46.0)
Hemoglobin: 9 g/dL — ABNORMAL LOW (ref 12.0–15.0)
MCH: 23.8 pg — ABNORMAL LOW (ref 26.0–34.0)
MCHC: 31.7 g/dL (ref 30.0–36.0)
MCV: 75.1 fL — ABNORMAL LOW (ref 80.0–100.0)
Platelets: 168 10*3/uL (ref 150–400)
RBC: 3.78 MIL/uL — ABNORMAL LOW (ref 3.87–5.11)
RDW: 15.8 % — ABNORMAL HIGH (ref 11.5–15.5)
WBC: 16 10*3/uL — ABNORMAL HIGH (ref 4.0–10.5)
nRBC: 0 % (ref 0.0–0.2)

## 2023-09-08 NOTE — Lactation Note (Signed)
This note was copied from a baby's chart. Lactation Consultation Note  Patient Name: Natasha Gill [id number] Date: 09/08/2023 Age:28 hours LC entered the room, MOB and family asleep at this time, [facility] services will follow up with family in the morning.   Maternal Data    Feeding Nipple Type: Slow - flow  LATCH Score Latch: Repeated attempts needed to sustain latch, nipple held in mouth throughout feeding, stimulation needed to elicit sucking reflex.  Audible Swallowing: Spontaneous and intermittent  Type of Nipple: Everted at rest and after stimulation  Comfort (Breast/Nipple): Soft / non-tender  Hold (Positioning): Assistance needed to correctly position infant at breast and maintain latch.  LATCH Score: 8   Lactation Tools Discussed/Used    Interventions    Discharge    Consult Status      Frederico Hamman 09/08/2023, 1:04 AM

## 2023-09-08 NOTE — Progress Notes (Signed)
Post Partum Day One Subjective: Doing well this morning. Overall feels sore from long pushing but doing ok. VB has started to slow down. Has been up and ambulating without dizziness. Spontaneously voiding without difficulties. Tolerating a regular diet without N/V. No HA, CP, or SOB.  Baby boy doing well at bedside. Does want circumcision for him.  Objective: Patient Vitals for the past 24 hrs:  BP Temp Temp src Pulse Resp SpO2  09/08/23 1106 130/78 97.9 F (36.6 C) Oral -- 18 100 %  09/08/23 1006 119/67 -- -- 83 18 100 %  09/08/23 0603 122/81 99.2 F (37.3 C) Oral 86 18 --  09/08/23 0303 122/77 98.5 F (36.9 C) Oral 96 18 --  09/07/23 2249 135/79 97.8 F (36.6 C) Oral 89 18 --  09/07/23 2130 134/75 98.5 F (36.9 C) Oral 82 18 --  09/07/23 2030 133/81 -- -- (!) 113 18 --  09/07/23 2015 121/79 -- -- (!) 105 -- --  09/07/23 2000 136/86 -- -- (!) 113 -- --  09/07/23 1950 -- 98.1 F (36.7 C) Oral -- -- --  09/07/23 1945 119/74 -- -- (!) 122 19 --  09/07/23 1930 99/74 -- -- (!) 110 -- --  09/07/23 1920 121/67 -- -- (!) 102 -- --  09/07/23 1919 121/67 -- -- (!) 102 -- --  09/07/23 1915 132/74 -- -- (!) 103 -- 96 %  09/07/23 1902 138/69 -- -- 80 20 --  09/07/23 1803 (!) 117/51 -- -- (!) 114 18 --  09/07/23 1731 (!) 93/43 -- -- (!) 101 20 --  09/07/23 1702 (!) 86/72 97.9 F (36.6 C) Oral (!) 124 20 --  09/07/23 1632 105/63 -- -- 100 20 --  09/07/23 1602 105/63 -- -- 91 20 --  09/07/23 1532 108/75 -- -- 88 20 --  09/07/23 1503 (!) 103/53 -- -- 80 20 --  09/07/23 1435 (!) 108/56 -- -- 84 20 --  09/07/23 1403 93/69 98 F (36.7 C) Oral (!) 126 20 --  09/07/23 1332 94/60 -- -- 78 20 --  09/07/23 1302 (!) 102/58 -- -- 73 20 --  09/07/23 1232 117/69 -- -- 84 18 --    Physical Exam:  General: alert and no distress Lochia: appropriate Uterine Fundus: firm DVT Evaluation: No evidence of DVT seen on physical exam.  Recent Labs    09/06/23 0940 09/07/23 0229 09/08/23 0452  WBC  11.9* 12.1* 16.0*  HGB 10.3* 10.6* 9.0*  HCT 33.1* 34.0* 28.4*  PLT 181 188 168    Recent Labs    09/06/23 0940 09/07/23 0229  NA 137 135  K 4.1 4.1  CL 104 106  BUN 10 12  CREATININE 0.79 0.76  GLUCOSE 116* 106*  BILITOT 0.5 0.4  ALT 13 13  AST 16 15  ALKPHOS 157* 160*  PROT 5.7* 6.1*  ALBUMIN 2.8* 3.0*    Recent Labs    09/06/23 0940 09/07/23 0229  CALCIUM 9.4 9.4    No results for input(s): "PROTIME", "APTT", "INR" in the last 72 hours.  No results for input(s): "PROTIME", "APTT", "INR", "FIBRINOGEN" in the last 72 hours. Assessment/Plan: Plan for discharge tomorrow  ARLYS TUAN 27 y.o. G2P1011 PPD#1 sp NSVD at [redacted]w[redacted]d 1. PPC: cont current PO pain regimen PRN, encourage ambulation, and regular diet 2. Acute on chronic blood loss anemia, clinically significant but stable and asymptomatic 3. RH POS 4. LC PRN  Consented for neonatal circumcision, plan for after 24hr  Plan for routine inpatient postpartum  care and discharge home tomorrow PPD2   LOS: 1 day   Taichi Repka A Gaetana Kawahara 09/08/2023, 12:13 PM

## 2023-09-08 NOTE — Lactation Note (Addendum)
This note was copied from a baby's chart. Lactation Consultation Note  Patient Name: Natasha Gill [id number] Date: 09/08/2023 Age:28 hours Reason for consult: Initial assessment;1st time breastfeeding  P1, Mother stated that she is concerned about her milk supply so she started supplementing with formula and has been using a pacifier.   Provided education regarding mother's milk supply and how it comes to volume. Provided volume guidelines encouraged breast before formula. Noted that baby has tight mid anterior lingual frenulum.  Suggest discussing with Ped MD. During assessment, noted baby is tongue sucking/thrusting. Assisted with latching in both cross cradle and football holds. Baby was able to sustain latch after a few attempts.  Encouraged waiting for wide open gape and unlatch and relatch if baby is not deep on breast. Feed on demand with cues.  Goal 8-12+ times per day after first 24 hrs.  Place baby STS if not cueing.  Suggest calling for  help with latching today.    Maternal Data Has patient been taught Hand Expression?: Yes Does the patient have breastfeeding experience prior to this delivery?: No  Feeding Mother's Current Feeding Choice: Breast Milk and Formula  LATCH Score Latch: Repeated attempts needed to sustain latch, nipple held in mouth throughout feeding, stimulation needed to elicit sucking reflex.  Audible Swallowing: A few with stimulation  Type of Nipple: Everted at rest and after stimulation  Comfort (Breast/Nipple): Soft / non-tender  Hold (Positioning): Assistance needed to correctly position infant at breast and maintain latch.  LATCH Score: 7   Lactation Tools Discussed/Used    Interventions Interventions: Breast feeding basics reviewed;Assisted with latch;Skin to skin;Hand express;Education;LC Services brochure  Discharge Discharge Education: Engorgement and breast care;Warning signs for feeding baby Pump: Personal;DEBP (Mom  Cozi)  Consult Status Consult Status: Follow-up Date: 09/09/23 Follow-up type: In-patient    Dahlia Byes [facility] 09/08/2023, 10:53 AM

## 2023-09-09 DIAGNOSIS — D509 Iron deficiency anemia, unspecified: Secondary | ICD-10-CM | POA: Insufficient documentation

## 2023-09-09 MED ORDER — SENNOSIDES-DOCUSATE SODIUM 8.6-50 MG PO TABS: 2.0000 | ORAL_TABLET | Freq: Every day | ORAL | Status: AC

## 2023-09-09 MED ORDER — IBUPROFEN 600 MG PO TABS: 600.0000 mg | ORAL_TABLET | Freq: Four times a day (QID) | ORAL | 0 refills | Status: AC

## 2023-09-09 MED ORDER — SIMETHICONE 80 MG PO CHEW: 80.0000 mg | CHEWABLE_TABLET | ORAL | Status: AC | PRN

## 2023-09-09 NOTE — Lactation Note (Signed)
This note was copied from a baby's chart. Lactation Consultation Note  Patient Name: Natasha Gill [id number] Date: 09/09/2023 Age:28 hours Reason for consult: Follow-up assessment;1st time breastfeeding;Primapara;Term  P1, 38 wks, @ 38 hrs of life. Encouraged mom in expectations of milk coming in over next 48 hours, putting baby to breast or pumping if baby is supplementing. Discussed attempting breast first. Highlighted differences of breast/bottle. Encouraged starting with hand expression and breast compression to overcome infant frustration @ slower flowing breast.Encouraged EBM or coconut oil to breast post feed. Highlighted the risk of engorgement if not softening/emptying breast every 3 hours- mom has pump at home- Mom cozy. Encouraged baby to breast to learn, pump after if desired. Highlighted inflammation/engorgement in breast best treated by softening breast with hand expression, motrin, and ice packs for 10-20 minutes as needed. Re-enforced LC O/P available, milk storage, and pump cleaning.   Maternal Data Does the patient have breastfeeding experience prior to this delivery?: No  Feeding Mother's Current Feeding Choice: Breast Milk and Formula Nipple Type: Slow - flow   Interventions Interventions: Breast feeding basics reviewed;Hand express;Breast compression;Expressed milk;Coconut oil;DEBP;Education;LC Services brochure;CDC Guidelines for Breast Pump Cleaning (Milk Storage Guidelines)  Discharge Discharge Education: Engorgement and breast care Pump: Hands Free;Personal (Mom-cozy)  Consult Status Consult Status: Complete Date: 09/09/23    Idamae Lusher 09/09/2023, 9:50 AM

## 2023-09-09 NOTE — Discharge Summary (Signed)
Postpartum Discharge Summary  Date of Service updated 09/09/23     Patient Name: Natasha Gill DOB: August 05, 1996 MRN: [id number]  Date of admission: 09/07/2023 Delivery date:09/07/2023 Delivering provider: Clance Boll A Date of discharge: 09/09/2023  Admitting diagnosis: Pregnancy [Z34.90] Encounter for induction of labor [Z34.90] Intrauterine pregnancy: [redacted]w[redacted]d     Secondary diagnosis:  Principal Problem:   Postpartum care following vaginal delivery 12/25 Active Problems:   Incompetent cervix in pregnancy, antepartum, second trimester   Pregnancy   Encounter for induction of labor   Iron deficiency anemia  Additional problems: n/a    Discharge diagnosis: Term Pregnancy Delivered                                              Post partum procedures: n/a Augmentation: AROM and Pitocin Complications: None  Hospital course: Onset of Labor With Vaginal Delivery      27 y.o. yo G2P1011 at [redacted]w[redacted]d was admitted in Latent Labor on 09/07/2023. Labor course was complicated by prolonged second stage.  Membrane Rupture Time/Date: 8:13 AM,09/07/2023  Delivery Method:Vaginal, Spontaneous Operative Delivery:N/A Episiotomy: None Lacerations:  1st degree;Perineal Patient had a postpartum course complicated by none.  She is ambulating, tolerating a regular diet, passing flatus, and urinating well. Patient is discharged home in stable condition on 09/09/23.  Newborn Data: Birth date:09/07/2023 Birth time:7:09 PM Gender:Female Living status:Living Apgars:6 ,7  Weight:3780 g  Magnesium Sulfate received: No BMZ received: No Rhophylac:N/A MMR:N/A T-DaP:Given prenatally  Immunizations administered: Immunization History  Administered Date(s) Administered   PFIZER(Purple Top)SARS-COV-2 Vaccination 12/01/2019, 12/29/2019    Physical exam  Vitals:   09/08/23 0603 09/08/23 1006 09/08/23 1106 09/09/23 0515  BP: 122/81 119/67 130/78 127/64  Pulse: 86 83  80  Resp: 18 18 18 16   Temp:  99.2 F (37.3 C)  97.9 F (36.6 C) 98.3 F (36.8 C)  TempSrc: Oral  Oral   SpO2:  100% 100%   Weight:      Height:       General: alert and no distress Lochia: appropriate Uterine Fundus: firm Incision: N/A DVT Evaluation: No evidence of DVT seen on physical exam. Labs: Lab Results  Component Value Date   WBC 16.0 (H) 09/08/2023   HGB 9.0 (L) 09/08/2023   HCT 28.4 (L) 09/08/2023   MCV 75.1 (L) 09/08/2023   PLT 168 09/08/2023      Latest Ref Rng & Units 09/07/2023    2:29 AM  CMP  Glucose 70 - 99 mg/dL 425   BUN 6 - 20 mg/dL 12   Creatinine 9.56 - 1.00 mg/dL 3.87   Sodium 564 - 332 mmol/L 135   Potassium 3.5 - 5.1 mmol/L 4.1   Chloride 98 - 111 mmol/L 106   CO2 22 - 32 mmol/L 20   Calcium 8.9 - 10.3 mg/dL 9.4   Total Protein 6.5 - 8.1 g/dL 6.1   Total Bilirubin <9.5 mg/dL 0.4   Alkaline Phos 38 - 126 U/L 160   AST 15 - 41 U/L 15   ALT 0 - 44 U/L 13    Edinburgh Score:    09/08/2023    3:00 AM  Edinburgh Postnatal Depression Scale Screening Tool  I have been able to laugh and see the funny side of things. 0  I have looked forward with enjoyment to things. 0  I have blamed myself  unnecessarily when things went wrong. 1  I have been anxious or worried for no good reason. 0  I have felt scared or panicky for no good reason. 0  Things have been getting on top of me. 1  I have been so unhappy that I have had difficulty sleeping. 0  I have felt sad or miserable. 0  I have been so unhappy that I have been crying. 0  The thought of harming myself has occurred to me. 0  Edinburgh Postnatal Depression Scale Total 2      After visit meds:  Allergies as of 09/09/2023       Reactions   Amoxicillin Swelling        Medication List     TAKE these medications    acetaminophen 325 MG tablet Commonly known as: TYLENOL Take 500 mg by mouth every 6 (six) hours as needed for moderate pain (pain score 4-6).   ibuprofen 600 MG tablet Commonly known as:  ADVIL Take 1 tablet (600 mg total) by mouth every 6 (six) hours.   prenatal multivitamin Tabs tablet Take 1 tablet by mouth daily at 12 noon.   senna-docusate 8.6-50 MG tablet Commonly known as: Senokot-S Take 2 tablets by mouth daily.   simethicone 80 MG chewable tablet Commonly known as: MYLICON Chew 1 tablet (80 mg total) by mouth as needed for flatulence.               Discharge Care Instructions  (From admission, onward)           Start     Ordered   09/09/23 0000  Discharge wound care:       Comments: Sitz baths 2 times /day with warm water x 1 week. May add herbals: 1 ounce dried comfrey leaf* 1 ounce calendula flowers 1 ounce lavender flowers  Supplies can be found online at [employer] sources at [employer], [employer]  1/2 ounce dried uva ursi leaves 1/2 ounce witch hazel blossoms (if you can find them) 1/2 ounce dried sage leaf 1/2 cup sea salt Directions: Bring 2 quarts of water to a boil. Turn off heat, and place 1 ounce (approximately 1 large handful) of the above mixed herbs (not the salt) into the pot. Steep, covered, for 30 minutes.  Strain the liquid well with a fine mesh strainer, and discard the herb material. Add 2 quarts of liquid to the tub, along with the 1/2 cup of salt. This medicinal liquid can also be made into compresses and peri-rinses.   09/09/23 1052             Discharge home in stable condition Infant Feeding: Bottle and Breast Infant Disposition:home with mother Discharge instruction: per After Visit Summary and Postpartum booklet. Activity: Advance as tolerated. Pelvic rest for 6 weeks.  Diet: routine diet Anticipated Birth Control: Unsure Postpartum Appointment:6 weeks Additional Postpartum F/U:  n/a Future Appointments:No future appointments. Follow up Visit:  Patient to follow up at [facility] for 6 week postpartum visit or sooner as needed    09/09/2023 Dierra Riesgo A Aris Moman, DO

## 2023-09-10 NOTE — Anesthesia Postprocedure Evaluation (Signed)
Anesthesia Post Note  Patient: Natasha Gill  Procedure(s) Performed: AN AD HOC LABOR EPIDURAL     Patient location during evaluation: Mother Baby Anesthesia Type: Epidural Level of consciousness: awake and alert Pain management: pain level controlled Vital Signs Assessment: post-procedure vital signs reviewed and stable Respiratory status: spontaneous breathing, nonlabored ventilation and respiratory function stable Cardiovascular status: stable Postop Assessment: no headache, no backache and epidural receding Anesthetic complications: no   No notable events documented.  Last Vitals: There were no vitals filed for this visit.  Last Pain: There were no vitals filed for this visit.               Report per CRNA

## 2023-09-16 ENCOUNTER — Telehealth (HOSPITAL_COMMUNITY): Payer: Self-pay | Admitting: *Deleted

## 2023-09-16 NOTE — Telephone Encounter (Signed)
 09/16/2023  Name: Natasha Gill MRN: [id number] DOB: [birth year]  Reason for Call:  Transition of Care Hospital Discharge Call  Contact Status: Patient Contact Status: Complete  Language assistant needed:          Follow-Up Questions: Do You Have Any Concerns About Your Health As You Heal From Delivery?: No Do You Have Any Concerns About Your Infants Health?: No  Edinburgh Postnatal Depression Scale:  In the Past 7 Days: I have been able to laugh and see the funny side of things.: As much as I always could I have looked forward with enjoyment to things.: As much as I ever did I have blamed myself unnecessarily when things went wrong.: No, never I have been anxious or worried for no good reason.: Hardly ever I have felt scared or panicky for no good reason.: No, not at all Things have been getting on top of me.: Yes, sometimes I haven't been coping as well as usual I have been so unhappy that I have had difficulty sleeping.: Not at all I have felt sad or miserable.: No, not at all I have been so unhappy that I have been crying.: No, never The thought of harming myself has occurred to me.: Never Edinburgh Postnatal Depression Scale Total: 3  PHQ2-9 Depression Scale:     Discharge Follow-up: Edinburgh score requires follow up?: No Patient was advised of the following resources:: Breastfeeding Support Group, Support Group  Post-discharge interventions: Reviewed Newborn Safe Sleep Practices  Signature Allean IVAR Carton, RN, 09/16/23, 1001

## 2023-09-21 ENCOUNTER — Inpatient Hospital Stay (HOSPITAL_COMMUNITY): Payer: No Typology Code available for payment source

## 2023-09-21 ENCOUNTER — Inpatient Hospital Stay (HOSPITAL_COMMUNITY)
Admission: RE | Admit: 2023-09-21 | Payer: No Typology Code available for payment source | Source: Home / Self Care | Admitting: Obstetrics and Gynecology
